# Patient Record
Sex: Female | Born: 1935 | Race: White | Hispanic: No | Smoking: Never smoker
Health system: Southern US, Community
[De-identification: ages and names within clinical notes are randomized; demographics above are authoritative.]

## PROBLEM LIST (undated history)

## (undated) DIAGNOSIS — E039 Hypothyroidism, unspecified: Secondary | ICD-10-CM

## (undated) DIAGNOSIS — K589 Irritable bowel syndrome without diarrhea: Secondary | ICD-10-CM

## (undated) DIAGNOSIS — F0390 Unspecified dementia without behavioral disturbance: Secondary | ICD-10-CM

## (undated) DIAGNOSIS — M1990 Unspecified osteoarthritis, unspecified site: Secondary | ICD-10-CM

## (undated) DIAGNOSIS — I10 Essential (primary) hypertension: Secondary | ICD-10-CM

## (undated) DIAGNOSIS — G43909 Migraine, unspecified, not intractable, without status migrainosus: Secondary | ICD-10-CM

## (undated) DIAGNOSIS — F419 Anxiety disorder, unspecified: Secondary | ICD-10-CM

## (undated) DIAGNOSIS — K219 Gastro-esophageal reflux disease without esophagitis: Secondary | ICD-10-CM

## (undated) HISTORY — PX: TONSILLECTOMY: SUR1361

---

## 2004-01-15 ENCOUNTER — Ambulatory Visit: Payer: Self-pay

## 2004-01-17 ENCOUNTER — Encounter: Payer: Self-pay | Admitting: Neurology

## 2004-02-09 ENCOUNTER — Encounter: Payer: Self-pay | Admitting: Neurology

## 2004-04-02 ENCOUNTER — Ambulatory Visit: Payer: Self-pay | Admitting: Gastroenterology

## 2004-06-18 ENCOUNTER — Ambulatory Visit: Payer: Self-pay | Admitting: Internal Medicine

## 2005-06-14 ENCOUNTER — Ambulatory Visit: Payer: Self-pay | Admitting: Specialist

## 2005-06-30 ENCOUNTER — Ambulatory Visit: Payer: Self-pay | Admitting: Internal Medicine

## 2005-12-10 ENCOUNTER — Ambulatory Visit: Payer: Self-pay

## 2006-07-04 ENCOUNTER — Ambulatory Visit: Payer: Self-pay | Admitting: Internal Medicine

## 2006-10-07 ENCOUNTER — Ambulatory Visit: Payer: Self-pay | Admitting: Internal Medicine

## 2006-12-01 ENCOUNTER — Encounter: Payer: Self-pay | Admitting: Internal Medicine

## 2006-12-10 ENCOUNTER — Encounter: Payer: Self-pay | Admitting: Internal Medicine

## 2007-01-09 ENCOUNTER — Encounter: Payer: Self-pay | Admitting: Internal Medicine

## 2007-08-17 ENCOUNTER — Ambulatory Visit: Payer: Self-pay | Admitting: Internal Medicine

## 2007-08-21 ENCOUNTER — Ambulatory Visit: Payer: Self-pay | Admitting: Internal Medicine

## 2008-05-28 ENCOUNTER — Encounter: Payer: Self-pay | Admitting: Internal Medicine

## 2008-06-08 ENCOUNTER — Encounter: Payer: Self-pay | Admitting: Internal Medicine

## 2008-07-15 ENCOUNTER — Encounter: Payer: Self-pay | Admitting: Internal Medicine

## 2008-07-30 ENCOUNTER — Ambulatory Visit: Payer: Self-pay | Admitting: Orthopedic Surgery

## 2008-08-01 ENCOUNTER — Ambulatory Visit: Payer: Self-pay | Admitting: Orthopedic Surgery

## 2008-08-08 ENCOUNTER — Encounter: Payer: Self-pay | Admitting: Internal Medicine

## 2008-12-17 ENCOUNTER — Ambulatory Visit: Payer: Self-pay | Admitting: Internal Medicine

## 2010-09-11 ENCOUNTER — Inpatient Hospital Stay: Payer: Self-pay | Admitting: Specialist

## 2011-06-19 ENCOUNTER — Inpatient Hospital Stay: Payer: Self-pay | Admitting: Internal Medicine

## 2011-06-19 LAB — CBC
HCT: 36.1 % (ref 35.0–47.0)
HGB: 12.6 g/dL (ref 12.0–16.0)
MCH: 29.6 pg (ref 26.0–34.0)
MCHC: 34.8 g/dL (ref 32.0–36.0)
MCV: 85 fL (ref 80–100)
RBC: 4.25 10*6/uL (ref 3.80–5.20)
RDW: 14.9 % — ABNORMAL HIGH (ref 11.5–14.5)

## 2011-06-19 LAB — COMPREHENSIVE METABOLIC PANEL
Bilirubin,Total: 0.8 mg/dL (ref 0.2–1.0)
Calcium, Total: 8.6 mg/dL (ref 8.5–10.1)
Co2: 24 mmol/L (ref 21–32)
Creatinine: 0.95 mg/dL (ref 0.60–1.30)
EGFR (Non-African Amer.): 57 — ABNORMAL LOW
Glucose: 121 mg/dL — ABNORMAL HIGH (ref 65–99)
Potassium: 3.5 mmol/L (ref 3.5–5.1)
SGPT (ALT): 19 U/L
Total Protein: 7.6 g/dL (ref 6.4–8.2)

## 2011-06-19 LAB — URINALYSIS, COMPLETE
Bacteria: NONE SEEN
Ketone: NEGATIVE
Nitrite: NEGATIVE
Protein: NEGATIVE
Squamous Epithelial: 1

## 2011-06-19 LAB — TROPONIN I: Troponin-I: <0.02 ng/mL

## 2011-06-20 LAB — BASIC METABOLIC PANEL
Anion Gap: 7 (ref 7–16)
Calcium, Total: 8.3 mg/dL — ABNORMAL LOW (ref 8.5–10.1)
EGFR (African American): >60
EGFR (Non-African Amer.): >60
Osmolality: 284 (ref 275–301)
Sodium: 142 mmol/L (ref 136–145)

## 2011-06-20 LAB — CBC WITH DIFFERENTIAL/PLATELET
Basophil #: 0 10*3/uL (ref 0.0–0.1)
Eosinophil %: 1.7 %
HCT: 33 % — ABNORMAL LOW (ref 35.0–47.0)
Lymphocyte #: 2.2 10*3/uL (ref 1.0–3.6)
Lymphocyte %: 10.4 %
MCH: 29.2 pg (ref 26.0–34.0)
MCHC: 34.4 g/dL (ref 32.0–36.0)
Monocyte #: 1.6 x10 3/mm — ABNORMAL HIGH (ref 0.2–0.9)
Neutrophil #: 17.1 10*3/uL — ABNORMAL HIGH (ref 1.4–6.5)
Platelet: 150 10*3/uL (ref 150–440)
RDW: 14.7 % — ABNORMAL HIGH (ref 11.5–14.5)

## 2011-06-21 LAB — CBC WITH DIFFERENTIAL/PLATELET
Eosinophil #: 0.5 10*3/uL (ref 0.0–0.7)
Eosinophil %: 3.6 %
Lymphocyte #: 2.3 10*3/uL (ref 1.0–3.6)
Lymphocyte %: 16.8 %
MCH: 29.2 pg (ref 26.0–34.0)
MCV: 85 fL (ref 80–100)
Monocyte #: 1.2 x10 3/mm — ABNORMAL HIGH (ref 0.2–0.9)
Neutrophil #: 9.5 10*3/uL — ABNORMAL HIGH (ref 1.4–6.5)
Platelet: 154 10*3/uL (ref 150–440)
RBC: 3.74 10*6/uL — ABNORMAL LOW (ref 3.80–5.20)
RDW: 14.7 % — ABNORMAL HIGH (ref 11.5–14.5)
WBC: 13.6 10*3/uL — ABNORMAL HIGH (ref 3.6–11.0)

## 2011-06-21 LAB — BASIC METABOLIC PANEL
Anion Gap: 6 — ABNORMAL LOW (ref 7–16)
BUN: 15 mg/dL (ref 7–18)
Calcium, Total: 8.2 mg/dL — ABNORMAL LOW (ref 8.5–10.1)
Chloride: 108 mmol/L — ABNORMAL HIGH (ref 98–107)
EGFR (African American): >60
EGFR (Non-African Amer.): >60
Osmolality: 287 (ref 275–301)

## 2011-06-22 LAB — BASIC METABOLIC PANEL
Anion Gap: 8 (ref 7–16)
BUN: 11 mg/dL (ref 7–18)
Calcium, Total: 8.2 mg/dL — ABNORMAL LOW (ref 8.5–10.1)
Chloride: 107 mmol/L (ref 98–107)
Co2: 28 mmol/L (ref 21–32)
Creatinine: 0.75 mg/dL (ref 0.60–1.30)
EGFR (Non-African Amer.): >60
Osmolality: 284 (ref 275–301)
Potassium: 3.1 mmol/L — ABNORMAL LOW (ref 3.5–5.1)

## 2011-06-22 LAB — CBC WITH DIFFERENTIAL/PLATELET
Basophil #: 0.1 10*3/uL (ref 0.0–0.1)
Lymphocyte #: 2.3 10*3/uL (ref 1.0–3.6)
Lymphocyte %: 19.3 %
Monocyte %: 10 %
Neutrophil #: 7.8 10*3/uL — ABNORMAL HIGH (ref 1.4–6.5)
Neutrophil %: 65.6 %
Platelet: 160 10*3/uL (ref 150–440)
WBC: 11.8 10*3/uL — ABNORMAL HIGH (ref 3.6–11.0)

## 2011-06-23 LAB — BASIC METABOLIC PANEL
Anion Gap: 9 (ref 7–16)
BUN: 13 mg/dL (ref 7–18)
Chloride: 107 mmol/L (ref 98–107)
Creatinine: 0.72 mg/dL (ref 0.60–1.30)
EGFR (Non-African Amer.): >60
Osmolality: 285 (ref 275–301)

## 2011-06-24 LAB — CULTURE, BLOOD (SINGLE)

## 2014-06-01 ENCOUNTER — Emergency Department
Admit: 2014-06-01 | Disposition: A | Discharge status: Home / Self Care | Payer: Self-pay | Admitting: Emergency Medicine

## 2014-06-02 LAB — URINALYSIS, COMPLETE
BILIRUBIN, UR: NEGATIVE
BLOOD: NEGATIVE
GLUCOSE, UR: NEGATIVE mg/dL (ref 0–75)
Ketone: NEGATIVE
LEUKOCYTE ESTERASE: NEGATIVE
Nitrite: NEGATIVE
PH: 5 (ref 4.5–8.0)
Protein: NEGATIVE
SPECIFIC GRAVITY: 1.013 (ref 1.003–1.030)

## 2014-06-02 LAB — BASIC METABOLIC PANEL
Anion Gap: 11 (ref 7–16)
BUN: 19 mg/dL
CALCIUM: 8.8 mg/dL — AB
CREATININE: 1.2 mg/dL — AB
Chloride: 93 mmol/L — ABNORMAL LOW
Co2: 29 mmol/L
GFR CALC AF AMER: 48 — AB
GFR CALC NON AF AMER: 42 — AB
Glucose: 113 mg/dL — ABNORMAL HIGH
POTASSIUM: 3 mmol/L — AB
SODIUM: 133 mmol/L — AB

## 2014-06-02 NOTE — H&P (Signed)
PATIENT NAME:  Regina Fields, Regina Fields MR#:  [id number] DATE OF BIRTH:  1928/04/19  DATE OF ADMISSION:  06/19/2011  PRIMARY CARE PHYSICIAN: Dr. Alonna Buckler.   REASON FOR ADMISSION: Patient is a resident of [facility] facility. Patient was sent from [facility] because of high grade fever, temperature of 103.6, cough and congestion, inattentive and poor appetite.   HISTORY OF PRESENT ILLNESS: 81 year old female, she is a resident of [facility] facility. She has history of hypertension, hypothyroidism, anxiety disorder, gastroesophageal reflux disease, history of C. difficile colitis in the past, irritable bowel syndrome, she has history of frequent UTIs in the past also. She was last admitted in August 2012 because of systemic inflammatory response syndrome, source unknown. Today she was sent from the facility because she was running a fever with temperature of 103.6. She is a poor historian. She is mainly complaining of like she is having a cough going on for a few days. She says she is coughing her brains out. She denies any expectoration or sputum production. She denies any shortness of breath or pleuritic chest pain. No sinus drainage. The vitals from [facility] facility says that she had a heart rate of 100, respiratory rate of 40 and a pulse ox of 96% but when she came to the Emergency Room her pulse ox was in the range of 91% on room air when she presented here. She denies any nausea, vomiting or any abdominal pain. Denies any diarrhea right now. Her had initial work-up in the Emergency Room showed that she had a white count of 24.6 thousand with a clean urine and a chest x-ray which was read as hyperinflation. She is being admitted for systemic inflammatory response syndrome with possible pneumonia. She already got a dose of ceftriaxone and Zithromax in the Emergency Room.   REVIEW OF SYSTEMS: Positive for fever. She denies any weakness. She is also complaining of poor appetite for last few days. No acute  change in vision. No headache. No dizziness. She is complaining of cough but she denies any painful respiration. No hemoptysis. No chronic obstructive pulmonary disease. No chest pain. No palpitations or syncope. No nausea, vomiting, abdominal pain, GI bleed. No diarrhea. She is complaining of dysuria which she says is chronic for her. Denies any frequency or hematuria. She has thyroid problems. No anemia. No rash. No joint pain. She has chronic leg swelling. She denies any focal numbness or weakness. No anxiety.    PAST MEDICAL HISTORY:  1. Hypothyroidism. 2. Anxiety disorder.  3. Gastroesophageal reflux disease.  4. Osteoarthritis.  5. Ankylosing arthritis of the hands.  6. She had atypical chest pain in the past with negative cardiac catheterization. 7. Osteoporosis. 8. History of positional vertigo.  9. History of left carpal tunnel syndrome.  10. Hypertension.  11. History of C. difficile colitis. 12. History of recurrent weak spells which have been ongoing for many years. 13. Irritable bowel syndrome. 14. Hyperlipidemia. 15. Diverticular disease. 16. Lower extremity varicosities with edema. 17. Asthma. 18. Colonic polyps.  19. She also has recurrent urinary tract infections and she is on Macrobid for prophylaxis.   PAST SURGICAL HISTORY:  1. Thyroidectomy resulting hypothyroidism. 2. Tonsillectomy. 3. C-section. 4. Left open reduction internal fixation of the shoulder in June 2010.   HOME MEDICATIONS: As per the [facility] obtained from the nursing home include:  1. Lasix 40 mg daily as needed for swelling.  2. Atenolol 50 mg daily.  3. Vitamin C 500 mg once a day. 4.  Norvasc 5 mg daily.  5. Macrobid 100 mg daily. 6. Vitamin E 400 units daily.  7. Multivitamin daily.  8. Lysine 500 mg daily. 9. Vitamin D 400 units daily.  10. Singulair 10 mg once daily. 11. Ginkgo biloba once daily.  12. Omega-3 fatty acid once daily.  13. Coenzyme Q10. 14. Aspirin 81 mg daily.  15. Motrin  200 mg twice a day. 16. Glucosamine chondroitin once daily.  17. Calcium and vitamin D twice a day. 18. Albuterol 2 puffs every four hours as needed.  19. Robitussin syrup q.4 hours p.r.n.  20. Tylenol p.r.n.   SOCIAL HISTORY: She is a resident of [place]. She denies any smoking or alcohol use. She is divorced with one child. 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    FAMILY HISTORY: Father died at 2072 of cancer. Mother died of congestive heart failure. Brother died at 6674 of cardiac arrest.   PHYSICAL EXAMINATION:  VITAL SIGNS: Her vitals at [facility] include temperature 103.6, blood pressure 144/80, heart rate 100, respiratory rate 40. Her vitals when she presented to the Emergency Room here include: Temperature 100.6, heart rate 93, respiratory rate 28, blood pressure 141/60, saturating 91% on room air. Currently her blood pressure is 119/58, 93% to 94% on 2 liters nasal cannula.   GENERAL: This is an elderly, obese Caucasian female. She is sitting upright in bed, does not appear to be in any acute distress though she is coughing.   HEENT: Bilateral pupils are equal. Extraocular muscles are intact. No scleral icterus. No conjunctivitis. Oral mucosa slightly dry. No pallor.   NECK: No thyroid tenderness, enlargement or nodule. Neck is supple. No masses, nontender. No adenopathy. No JVD. No carotid bruit.   CHEST: Normal respiratory effort but she has rhonchi and wheezing mainly in the right side, the left is clearer. Not using accessory muscles of respiration.   HEART: Heart sounds are regular. No murmur. She has good peripheral pulses. She has bilateral nonpitting lower extremity edema.   ABDOMEN: Obese, soft, nontender. Normal bowel sounds. No hepatomegaly. No bruit. No masses appreciable secondary to obesity.   NEUROLOGIC: She is awake. She is alert. She is oriented but she is a poor historian. She has difficulty recalling events. Her cranial  nerves are intact. Moving all extremities against gravity. No cyanosis. No clubbing.   SKIN: She has bilateral nonpitting lower extremity edema. No rashes. No lesions.   LABORATORY, DIAGNOSTIC AND RADIOLOGICAL DATA: Her lab work today shows that she has white count 24.6, hemoglobin 12.6, platelet count 160,000. Her last white count was 9.2 . BMP: Sodium 140, potassium 3.5, BUN 15, creatinine 0.95. LFTs are normal. Her troponin is negative. Her urinalysis is essentially clean with nitrite and leukocyte esterase and WBC negative. Her chest x-ray has been reported as hyperinflation with right lung presumed atelectasis.   IMPRESSION:  1. Systemic inflammatory response syndrome as evidenced by fever, leukocytosis, hypoxia and tachycardia, suspect pneumonia on the right side. 2. Hypertension. 3. Hypothyroidism. 4. Hyperlipidemia. 5. History of asthma.   PLAN: An 81 year old female. She is a resident [facility]. She has history of hypertension, hyperlipidemia, hypothyroidism, osteoarthritis, asthma and today she comes in with cough going on for a few days. The reason she was sent to the Emergency Room was high-grade fever. She was mildly hypoxic in the Emergency Room, around 91% on room air. She has wheezing and rhonchi on the right side more than the  left. The chest x-ray is not reported as pneumonia but with a white count of 24,000, fever and abnormal lung exam will treat it as pneumonia. She has already been started on ceftriaxone and Zithromax; will continue that. Blood cultures have been sent. Will repeat a chest x-ray PA and lateral view tomorrow after hydration to see if any infiltrate develops after hydration. Will start her on some Robitussin, DuoNebs p.r.n. also. Her urinalysis has been negative and she is not complaining of any diarrhea. If diarrhea happens then a stool for Clostridium difficile should be checked on this patient because of her previous history of Clostridium difficile  colitis. Will also give her albuterol nebs.     TIME SPENT WITH ADMISSION AND COORDINATION: 40 minutes.   ____________________________ Fredia Sorrow, MD ag:cms D: 06/19/2011 15:22:37 ET T: 06/19/2011 15:57:46 ET JOB#: [id number]  cc: Fredia Sorrow, MD, <Dictator> Reola Mosher. Randa Lynn, MD Fredia Sorrow MD ELECTRONICALLY SIGNED 07/17/2011 13:46

## 2014-06-02 NOTE — Discharge Summary (Signed)
PATIENT NAME:  Regina Fields, Regina Fields MR#:  [id number] DATE OF BIRTH:  1928/10/16  DATE OF ADMISSION:  06/19/2011 DATE OF DISCHARGE:  06/23/2011   ADMITTING DIAGNOSIS: Fever, cough.   DISCHARGE DIAGNOSES:  1. Fever, cough due to likely community-acquired pneumonia. The patient is afebrile, cough improved.  2. Hypertension.  3. Hypothyroidism.   4. Gastroesophageal reflux disease.  5. Electrolyte imbalance.  6. Hypothyroidism.  7. Anxiety disorder.  8. Osteoarthritis.  9. Ankylosing arthritis of the hands.  10. History of atypical chest pain with negative cardiac cath in the past.  11. Osteoporosis.  12. SIRS syndrome due to pneumonia.  13. History of positional vertigo.  14. History of left carpal tunnel syndrome.  15. History of C. difficile.  16. History of recurrent weak spells going on for many years.  17. Irritable bowel syndrome.  18. Hyperlipidemia.  19. Diverticular disease.  20. Lower extremity varicosities with edema.  21. History of asthma.  22. Colonic polyps.  23. History of recurrent urinary tract infections and is on chronic Macrobid prophylaxis.  24. Status post thyroidectomy.  25. Status post tonsillectomy.  26. Status post Cesarean section.  27. Status post left ORIF for shoulder injury in 2010.   PERTINENT LABS AND EVALUATIONS: Admitting glucose 121, BUN 15, creatinine 0.95, sodium 140, potassium 3.5, chloride 107, CO2 24, calcium 8.6. LFTs total protein 7.6, albumin 3.3, bilirubin total 0.8, alkaline phosphatase 76. Troponin less than 0.02. WBC count on admission was 24.6, hemoglobin 12.6, platelet count 160. Most recent WBC count is 11.8 on May 14th. Urine cultures no growth. Blood cultures no growth.   Chest x-ray on presentation showed possible mild interstitial edema, trace to small bilateral effusions.   CONSULTANTS: None.   HOSPITAL COURSE: The patient is a pleasant 81 year old white female with multiple medical problems, resident at [facility] facility, who  was noted to have fevers of 103.6. The patient was also complaining of having cough for the past few days. The patient at the facility was noted to have respiratory rate in the 40's and drop in oxygen sat. Due to these, she was brought to the ED. In the ER she was noted to have a WBC count of 24,000. Chest x-ray showed bilateral interstitial infiltrates and thought to have possible atypical pneumonia. She was treated with antibiotics with Rocephin and azithromycin with resolution of her fevers as well as her respiratory symptoms. The patient is doing much better and will finish a course of antibiotics at the facility.   DISCHARGE MEDICATIONS:  1. Lasix 40 daily.  2. Atenolol 50 daily.  3. Vitamin C 500 daily.  4. Norvasc 5 daily.  5. Macrobid 100 daily.  6. Vitamin E 400 units daily.  7. Multivitamin daily.  8. Lysine 500 daily.  9. Vitamin D 400 units daily.  10. Singular 10 daily.  11. Ginkgo biloba one tablet p.o. daily.  12. Omega-3 fatty acid daily.  13. Co-Enzyme Q10 daily.  14. Aspirin 81 one tab p.o. daily.  15. Motrin 200 one tab p.o. b.i.d.  16. Glucosamine/chondroitin 1 tab p.o. daily.  17. Calcium Plus Vitamin D one tab p.o. b.i.d.  18. Albuterol 2 puffs q.4 p.r.n.  19. Robitussin q.4 p.r.n.  20. Tylenol p.r.n.  21. Imodium AD 2 tabs as needed.  22. Ceftin 500 p.o. q.12 hours x3 days.  23. Azithromycin 500 p.o. daily x3 days.   HOME OXYGEN: None.   DIET: Low sodium.   ACTIVITY: As tolerated.   REFERRALS: PT and OT.  FOLLOW-UP: Follow-up with Dr. Randa Lynn in 1 to 2 weeks.  TIME SPENT: 35 minutes.   ____________________________ Lacie Scotts Allena Katz, MD shp:drc D: 06/23/2011 13:11:34 ET T: 06/23/2011 13:37:51 ET JOB#: [id number]  cc: Justo Hengel Fields. Allena Katz, MD, <Dictator> Reola Mosher. Randa Lynn, MD Charise Carwin MD ELECTRONICALLY SIGNED 06/25/2011 14:44

## 2014-06-05 LAB — URINE CULTURE

## 2015-03-01 ENCOUNTER — Emergency Department: Payer: Medicare Other

## 2015-03-01 ENCOUNTER — Inpatient Hospital Stay
Admission: EM | Admit: 2015-03-01 | Discharge: 2015-03-05 | DRG: 189 | Disposition: A | Discharge status: Skilled Nursing Facility | Payer: Medicare Other | Attending: Internal Medicine | Admitting: Internal Medicine

## 2015-03-01 DIAGNOSIS — R0600 Dyspnea, unspecified: Secondary | ICD-10-CM

## 2015-03-01 DIAGNOSIS — N170 Acute kidney failure with tubular necrosis: Secondary | ICD-10-CM | POA: Diagnosis present

## 2015-03-01 DIAGNOSIS — R600 Localized edema: Secondary | ICD-10-CM | POA: Diagnosis present

## 2015-03-01 DIAGNOSIS — J9600 Acute respiratory failure, unspecified whether with hypoxia or hypercapnia: Secondary | ICD-10-CM | POA: Diagnosis present

## 2015-03-01 DIAGNOSIS — J9601 Acute respiratory failure with hypoxia: Secondary | ICD-10-CM | POA: Diagnosis present

## 2015-03-01 DIAGNOSIS — I10 Essential (primary) hypertension: Secondary | ICD-10-CM | POA: Diagnosis present

## 2015-03-01 DIAGNOSIS — E039 Hypothyroidism, unspecified: Secondary | ICD-10-CM | POA: Diagnosis present

## 2015-03-01 DIAGNOSIS — E876 Hypokalemia: Secondary | ICD-10-CM | POA: Diagnosis present

## 2015-03-01 DIAGNOSIS — J209 Acute bronchitis, unspecified: Secondary | ICD-10-CM | POA: Diagnosis present

## 2015-03-01 DIAGNOSIS — J069 Acute upper respiratory infection, unspecified: Secondary | ICD-10-CM

## 2015-03-01 DIAGNOSIS — F0390 Unspecified dementia without behavioral disturbance: Secondary | ICD-10-CM | POA: Diagnosis present

## 2015-03-01 DIAGNOSIS — R0602 Shortness of breath: Secondary | ICD-10-CM

## 2015-03-01 DIAGNOSIS — R0682 Tachypnea, not elsewhere classified: Secondary | ICD-10-CM | POA: Diagnosis present

## 2015-03-01 HISTORY — DX: Unspecified dementia, unspecified severity, without behavioral disturbance, psychotic disturbance, mood disturbance, and anxiety: F03.90

## 2015-03-01 LAB — CBC WITH DIFFERENTIAL/PLATELET
Basophils Absolute: 0.1 10*3/uL (ref 0–0.1)
Basophils Relative: 1 %
EOS ABS: 0.3 10*3/uL (ref 0–0.7)
Eosinophils Relative: 2 %
HEMATOCRIT: 39.9 % (ref 35.0–47.0)
Hemoglobin: 13.2 g/dL (ref 12.0–16.0)
LYMPHS ABS: 3.1 10*3/uL (ref 1.0–3.6)
Lymphocytes Relative: 22 %
MCH: 27.6 pg (ref 26.0–34.0)
MCHC: 33.1 g/dL (ref 32.0–36.0)
MCV: 83.5 fL (ref 80.0–100.0)
MONO ABS: 2.7 10*3/uL — AB (ref 0.2–0.9)
Monocytes Relative: 19 %
NEUTROS PCT: 56 %
Neutro Abs: 7.9 10*3/uL — ABNORMAL HIGH (ref 1.4–6.5)
PLATELETS: 167 10*3/uL (ref 150–440)
RBC: 4.78 MIL/uL (ref 3.80–5.20)
RDW: 15.3 % — AB (ref 11.5–14.5)
WBC: 14.1 10*3/uL — ABNORMAL HIGH (ref 3.6–11.0)

## 2015-03-01 LAB — TROPONIN I: Troponin I: <0.03 ng/mL (ref <0.031)

## 2015-03-01 LAB — COMPREHENSIVE METABOLIC PANEL
ALK PHOS: 74 U/L (ref 38–126)
ALT: 22 U/L (ref 14–54)
AST: 35 U/L (ref 15–41)
Albumin: 4.1 g/dL (ref 3.5–5.0)
Anion gap: 8 (ref 5–15)
BILIRUBIN TOTAL: 0.5 mg/dL (ref 0.3–1.2)
BUN: 25 mg/dL — AB (ref 6–20)
CALCIUM: 8.9 mg/dL (ref 8.9–10.3)
CO2: 31 mmol/L (ref 22–32)
Chloride: 98 mmol/L — ABNORMAL LOW (ref 101–111)
Creatinine, Ser: 1.29 mg/dL — ABNORMAL HIGH (ref 0.44–1.00)
GFR, EST AFRICAN AMERICAN: 43 mL/min — AB (ref >60)
GFR, EST NON AFRICAN AMERICAN: 37 mL/min — AB (ref >60)
Glucose, Bld: 104 mg/dL — ABNORMAL HIGH (ref 65–99)
Potassium: 3.2 mmol/L — ABNORMAL LOW (ref 3.5–5.1)
Sodium: 137 mmol/L (ref 135–145)
Total Protein: 8.8 g/dL — ABNORMAL HIGH (ref 6.5–8.1)

## 2015-03-01 LAB — RAPID INFLUENZA A&B ANTIGENS: Influenza B (ARMC): NEGATIVE

## 2015-03-01 LAB — RAPID INFLUENZA A&B ANTIGENS (ARMC ONLY): INFLUENZA A (ARMC): NEGATIVE

## 2015-03-01 LAB — LACTIC ACID, PLASMA: Lactic Acid, Venous: 1.9 mmol/L (ref 0.5–2.0)

## 2015-03-01 MED ORDER — IPRATROPIUM-ALBUTEROL 0.5-2.5 (3) MG/3ML IN SOLN
3.0000 mL | Freq: Four times a day (QID) | RESPIRATORY_TRACT | Status: DC
  Administered 2015-03-01 – 2015-03-05 (×15): 3 mL via RESPIRATORY_TRACT
  Filled 2015-03-01 (×15): qty 3

## 2015-03-01 MED ORDER — DEXTROSE 5 % IV SOLN
500.0000 mg | INTRAVENOUS | Status: DC
  Administered 2015-03-01 – 2015-03-03 (×3): 500 mg via INTRAVENOUS
  Filled 2015-03-01 (×4): qty 500

## 2015-03-01 MED ORDER — POTASSIUM CHLORIDE 10 MEQ/100ML IV SOLN
10.0000 meq | INTRAVENOUS | Status: AC
  Administered 2015-03-01 – 2015-03-02 (×3): 10 meq via INTRAVENOUS
  Filled 2015-03-01 (×4): qty 100

## 2015-03-01 MED ORDER — ONDANSETRON HCL 4 MG/2ML IJ SOLN: 4.0000 mg | Freq: Four times a day (QID) | INTRAMUSCULAR | Status: DC | PRN

## 2015-03-01 MED ORDER — BISACODYL 5 MG PO TBEC
5.0000 mg | DELAYED_RELEASE_TABLET | Freq: Every day | ORAL | Status: DC | PRN
  Filled 2015-03-01: qty 1

## 2015-03-01 MED ORDER — FUROSEMIDE 10 MG/ML IJ SOLN
40.0000 mg | Freq: Once | INTRAMUSCULAR | Status: AC
  Administered 2015-03-01: 40 mg via INTRAVENOUS
  Filled 2015-03-01: qty 4

## 2015-03-01 MED ORDER — DOCUSATE SODIUM 100 MG PO CAPS
100.0000 mg | ORAL_CAPSULE | Freq: Two times a day (BID) | ORAL | Status: DC
  Administered 2015-03-02 – 2015-03-05 (×6): 100 mg via ORAL
  Filled 2015-03-01 (×6): qty 1

## 2015-03-01 MED ORDER — ACETAMINOPHEN 650 MG RE SUPP: 650.0000 mg | Freq: Four times a day (QID) | RECTAL | Status: DC | PRN

## 2015-03-01 MED ORDER — AZITHROMYCIN 250 MG PO TABS
500.0000 mg | ORAL_TABLET | Freq: Once | ORAL | Status: DC
  Filled 2015-03-01: qty 2

## 2015-03-01 MED ORDER — HYDROCODONE-ACETAMINOPHEN 5-325 MG PO TABS
1.0000 | ORAL_TABLET | ORAL | Status: DC | PRN
  Administered 2015-03-02: 1 via ORAL
  Filled 2015-03-01: qty 1

## 2015-03-01 MED ORDER — HEPARIN SODIUM (PORCINE) 5000 UNIT/ML IJ SOLN
5000.0000 [IU] | Freq: Three times a day (TID) | INTRAMUSCULAR | Status: DC
  Administered 2015-03-01 – 2015-03-05 (×11): 5000 [IU] via SUBCUTANEOUS
  Filled 2015-03-01 (×11): qty 1

## 2015-03-01 MED ORDER — DEXTROSE 5 % IV SOLN
1.0000 g | INTRAVENOUS | Status: DC
  Administered 2015-03-01 – 2015-03-05 (×4): 1 g via INTRAVENOUS
  Filled 2015-03-01 (×5): qty 10

## 2015-03-01 MED ORDER — ONDANSETRON HCL 4 MG PO TABS: 4.0000 mg | ORAL_TABLET | Freq: Four times a day (QID) | ORAL | Status: DC | PRN

## 2015-03-01 MED ORDER — ACETAMINOPHEN 325 MG PO TABS: 650.0000 mg | ORAL_TABLET | Freq: Four times a day (QID) | ORAL | Status: DC | PRN

## 2015-03-01 MED ORDER — DEXTROSE 5 % IV SOLN
1.0000 g | Freq: Once | INTRAVENOUS | Status: AC
  Administered 2015-03-01: 1 g via INTRAVENOUS
  Filled 2015-03-01: qty 10

## 2015-03-01 NOTE — ED Notes (Signed)
ptassisted with bedpan with return of approx slightly concentrated yellow urine.

## 2015-03-01 NOTE — H&P (Signed)
[facility] [place] [facility]   PATIENT NAME: Regina Fields    MR#:  [id number]  DATE OF BIRTH:  10-06-1928  DATE OF ADMISSION:  03/01/2015  PRIMARY CARE PHYSICIAN: No primary care provider on file.   REQUESTING/REFERRING PHYSICIAN:DR.Minna Antis  CHIEF COMPLAINT:   Chief Complaint  Patient presents with  . Shortness of Breath    HISTORY OF PRESENT ILLNESS:  Regina Fields  is a 82 y.o. female with a known history of dementia brought in from [facility]  [place] because of shortness of breath, increased respiratory rate up to 30s, hypoxia with O2 sats 90% on room air. Patient chest x-ray negative for pneumonia, because of hypoxia with O2 sats 90% on room air, increased respiratory rate up to 30s patient was referred for admission. Patient has dementia and unable to give me any history. Has some expiratory wheezing bilaterally.  PAST MEDICAL HISTORY:   Past Medical History  Diagnosis Date  . Dementia     PAST SURGICAL HISTOIRY:  No past surgical history on file.  SOCIAL HISTORY:   Social History  Substance Use Topics  . Smoking status: Not on file  . Smokeless tobacco: Not on file  . Alcohol Use: Not on file    FAMILY HISTORY:  No family history on file.  DRUG ALLERGIES:  No Known Allergies  REVIEW OF SYSTEMS:unable to obtain RoS due to dementia.    MEDICATIONS AT HOME:   Prior to Admission medications   Not on File      VITAL SIGNS:  Blood pressure 139/60, pulse 76, temperature 99.8 F (37.7 C), temperature source Oral, resp. rate 25, weight 63.504 kg (140 lb), SpO2 97 %.  PHYSICAL EXAMINATION:  GENERAL:  82 y.o.-year-old patient lying in the bed with no acute distress.  EYES: Pupils equal, round, reactive to light and accommodation. No scleral icterus. Extraocular muscles intact.  HEENT: Head atraumatic, normocephalic. Oropharynx and nasopharynx clear.  NECK:  Supple, no jugular venous distention. No thyroid enlargement, no  tenderness.  LUNGS: Bilateral Expiratory wheeze  In all lung fields,  No rales,rhonchi or crepitation. No use of accessory muscles of respiration.  CARDIOVASCULAR: S1, S2 normal. No murmurs, rubs, or gallops.  ABDOMEN: Soft, nontender, nondistended. Bowel sounds present. No organomegaly or mass.  EXTREMITIES:  Bilateral trace pedal edema,  No cyanosis, or clubbing.  NEUROLOGIC:  Unable to obtain full ROS due to dementia. PSYCHIATRIC: The patient  Is confused/baseline dementia SKIN: No obvious rash, lesion, or ulcer.   LABORATORY PANEL:   CBC  Recent Labs Lab 03/01/15 1518  WBC 14.1*  HGB 13.2  HCT 39.9  PLT 167   ------------------------------------------------------------------------------------------------------------------  Chemistries   Recent Labs Lab 03/01/15 1518  NA 137  K 3.2*  CL 98*  CO2 31  GLUCOSE 104*  BUN 25*  CREATININE 1.29*  CALCIUM 8.9  AST 35  ALT 22  ALKPHOS 74  BILITOT 0.5   ------------------------------------------------------------------------------------------------------------------  Cardiac Enzymes  Recent Labs Lab 03/01/15 1518  TROPONINI <0.03   ------------------------------------------------------------------------------------------------------------------  RADIOLOGY:  Dg Chest 2 View  03/01/2015  CLINICAL DATA:  Shortness of breath starting this morning, dementia EXAM: CHEST  2 VIEW COMPARISON:  06/20/2011 FINDINGS: Cardiomediastinal silhouette is stable. Mild interstitial prominence bilateral without convincing pulmonary edema. Metallic fixation plate and screws in proximal left humerus again noted. No segmental infiltrate. Osteopenia and degenerative changes thoracic spine. Again noted significant compression deformity upper lumbar spine stable. IMPRESSION: Mild interstitial prominence bilateral without convincing pulmonary edema. No segmental  infiltrate. Osteopenia and mild degenerative changes thoracic spine. Electronically  Signed   By: Natasha Mead M.D.   On: 03/01/2015 15:53    EKG:   Orders placed or performed during the hospital encounter of 03/01/15  . ED EKG  . ED EKG  Sinus rhythm 71 BPM  IMPRESSION AND PLAN:  1. Acute hypoxic respiratory failure likely secondary to bronchitis: Patient is a admitted to hospitalist service, continue oxygen, started on Rocephin, Zithromax. Continue nebulizers. Likely discharge tomorrow. #2 hypokalemia replace the potassium. #3 mild acute renal failure likely ATN: She doesn't look  dehydrated. Does have  Leg edema,   #4. Dementia;  CODE STATUS full code     All the records are reviewed and case discussed with ED provider. Management plans discussed with the patient, family and they are in agreement.  CODE STATUS:full  TOTAL TIME TAKING CARE OF THIS PATIENT: 55 min.    Katha Hamming M.D on 03/01/2015 at 6:51 PM  Between 7am to 6pm - Pager - [phone]  After 6pm go to [email or web address] - password [email or web address]  [place] [facility]  Office  [phone]  CC: Primary care physician; No primary care provider on file.  Note: This dictation was prepared with Dragon dictation along with smaller phrase technology. Any transcriptional errors that result from this process are unintentional.

## 2015-03-01 NOTE — ED Provider Notes (Signed)
[facility] Emergency Department Provider Note  Time seen: 3:10 PM  I have reviewed the triage vital signs and the nursing notes.   HISTORY  Chief Complaint Shortness of Breath    HPI Regina Fields is a 82 y.o. female with a past medical history of dementia who presents from [facility] [place] skilled [facility] facility with increased respiratory rate, para shortness of breath, and room air oxygen saturation 91%. Patient has baseline confusion and cannot give an adequate history. Here the patient denies any trouble breathing, cough, congestion, chest pain. However patient appears to have tachypnea, and speaks in 2-3 word sentences.     Past Medical History  Diagnosis Date  . Dementia     There are no active problems to display for this patient.   No past surgical history on file.  No current outpatient prescriptions on file.  Allergies Review of patient's allergies indicates no known allergies.  No family history on file.  Social History Social History  Substance Use Topics  . Smoking status: None  . Smokeless tobacco: None  . Alcohol Use: None    Review of Systems Unable to obtain an adequate review of systems due to dementia.  ____________________________________________   PHYSICAL EXAM:  VITAL SIGNS: ED Triage Vitals  Enc Vitals Group     BP 03/01/15 1458 155/72 mmHg     Pulse --      Resp --      Temp 03/01/15 1458 99.8 F (37.7 C)     Temp Source 03/01/15 1458 Oral     SpO2 03/01/15 1458 93 %     Weight 03/01/15 1458 140 lb (63.504 kg)     Height --      Head Cir --      Peak Flow --      Pain Score --      Pain Loc --      Pain Edu? --      Excl. in GC? --     Constitutional: Alert.Well appearing and in no distress. Eyes: Normal exam ENT   Head: Normocephalic and atraumatic.   Mouth/Throat: Mucous membranes are moist. Cardiovascular: Normal rate, regular rhythm. No murmur Respiratory: No respiratory distress, and  the patient does have mild tachypnea breathing around 20-25 breaths per minute. Moderate expiratory wheeze on the left lung fields. No obvious rhonchi or rales. Gastrointestinal: Soft and nontender. No distention.   Musculoskeletal: Nontender with normal range of motion in all extremities. No lower extremity tenderness or edema. Neurologic:  Normal speech and language. No gross focal neurologic deficits Skin:  Skin is warm, dry and intact.  Psychiatric: Mood and affect are normal. Calm and cooperative. ____________________________________________    EKG  EKG reviewed and interpreted by myself shows normal sinus rhythm at 71 bpm, narrow QRS, left axis deviation, largely normal intervals, nonspecific ST changes. No ST elevations.  ____________________________________________    RADIOLOGY  Chest x-ray shows mild interstitial prominence, without convincing pulmonary edema. No infiltrate.  ____________________________________________   INITIAL IMPRESSION / ASSESSMENT AND PLAN / ED COURSE  Pertinent labs & imaging results that were available during my care of the patient were reviewed by me and considered in my medical decision making (see chart for details).  Patient presents the emergency department from her nursing facility for shortness of breath and a low respiratory rate. Patient was also noted to have a temperature of 99.8. Patient has dementia and cannot give an adequate history. Based on her exam the patient does  have mild tachypnea as well as moderate left-sided wheeze, with a room air oxygen saturation around 90%, now placed on 3 L nasal cannula satting in the low to mid 90s. We will obtain labs, blood cultures, chest x-ray, and closely monitor in the emergency department.  Chest x-ray shows no clear pneumonia. However the patient does have an elevated temperature, elevated white blood cell count, elevated respiratory rate, elevated respiratory rate and room air oxygen saturation of  90% currently. Given these findings we will cover with antibiotics and admit to the hospital.    ____________________________________________   FINAL CLINICAL IMPRESSION(S) / ED DIAGNOSES  Dyspnea URI  Minna Antis, MD 03/01/15 [phone]

## 2015-03-01 NOTE — ED Notes (Signed)
Report from felicia, rn.  

## 2015-03-01 NOTE — ED Notes (Signed)
Pts son called and updated on pts status.

## 2015-03-01 NOTE — ED Notes (Signed)
Pt came to ED via EMS from [facility]. Per staff at [facility], pt began experiencing shortness of breath this morning and had a respiratory rate of 99.8. Pt satting 91% on r/a with EMS. Placed on 3 L .

## 2015-03-01 NOTE — ED Notes (Signed)
Pts son Trey Paula (power of attorney) called to receive updates on pt. Can be reached at [phone].

## 2015-03-01 NOTE — ED Notes (Signed)
Pt complains of pain to hand where potassium infusing, no s/s of infiltration noted. Rate slowed to 13mL/hr, 30meq/hr.

## 2015-03-02 ENCOUNTER — Encounter: Payer: Self-pay | Admitting: *Deleted

## 2015-03-02 LAB — BASIC METABOLIC PANEL
ANION GAP: 8 (ref 5–15)
BUN: 23 mg/dL — ABNORMAL HIGH (ref 6–20)
CALCIUM: 8.4 mg/dL — AB (ref 8.9–10.3)
CHLORIDE: 99 mmol/L — AB (ref 101–111)
CO2: 30 mmol/L (ref 22–32)
Creatinine, Ser: 1.25 mg/dL — ABNORMAL HIGH (ref 0.44–1.00)
GFR calc non Af Amer: 38 mL/min — ABNORMAL LOW (ref >60)
GFR, EST AFRICAN AMERICAN: 44 mL/min — AB (ref >60)
GLUCOSE: 89 mg/dL (ref 65–99)
POTASSIUM: 3.6 mmol/L (ref 3.5–5.1)
Sodium: 137 mmol/L (ref 135–145)

## 2015-03-02 LAB — CBC
HEMATOCRIT: 33.7 % — AB (ref 35.0–47.0)
HEMOGLOBIN: 11.2 g/dL — AB (ref 12.0–16.0)
MCH: 27.7 pg (ref 26.0–34.0)
MCHC: 33.3 g/dL (ref 32.0–36.0)
MCV: 83.2 fL (ref 80.0–100.0)
Platelets: 129 10*3/uL — ABNORMAL LOW (ref 150–440)
RBC: 4.06 MIL/uL (ref 3.80–5.20)
RDW: 14.8 % — ABNORMAL HIGH (ref 11.5–14.5)
WBC: 11.7 10*3/uL — ABNORMAL HIGH (ref 3.6–11.0)

## 2015-03-02 LAB — GLUCOSE, CAPILLARY: GLUCOSE-CAPILLARY: 92 mg/dL (ref 65–99)

## 2015-03-02 LAB — MRSA PCR SCREENING: MRSA by PCR: NEGATIVE

## 2015-03-02 MED ORDER — POTASSIUM CHLORIDE 10 MEQ/100ML IV SOLN
10.0000 meq | Freq: Once | INTRAVENOUS | Status: AC
  Administered 2015-03-02: 10 meq via INTRAVENOUS
  Filled 2015-03-02: qty 100

## 2015-03-02 MED ORDER — CETYLPYRIDINIUM CHLORIDE 0.05 % MT LIQD
7.0000 mL | Freq: Two times a day (BID) | OROMUCOSAL | Status: DC
  Administered 2015-03-02 – 2015-03-05 (×5): 7 mL via OROMUCOSAL

## 2015-03-02 NOTE — [place] FL2 (Signed)
MEDICAID FL2 LEVEL OF CARE SCREENING TOOL     IDENTIFICATION  Patient Name: Regina Fields Birthdate: 1928-11-18 Sex: female Admission Date (Current Location): 03/01/2015  [place] and [place] Number:  Chiropodist and Address:  [facility], [address], [place], [place] [address]      Provider Number: [id number]  Attending Physician Name and Address:  Enedina Finner, MD  Relative Name and Phone Number:       Current Level of Care: Hospital Recommended Level of Care: Skilled Nursing Facility Prior Approval Number:    Date Approved/Denied:   PASRR Number:  ( [id number] )  Discharge Plan: SNF    Current Diagnoses: Patient Active Problem List   Diagnosis Date Noted  . Acute respiratory failure (HCC) 03/01/2015    Orientation RESPIRATION BLADDER Height & Weight    Self  O2 (2 Liters Oxygen ) Continent   139 lbs.  BEHAVIORAL SYMPTOMS/MOOD NEUROLOGICAL BOWEL NUTRITION STATUS   (none )  (none ) Continent Diet (Diet: 2 grams sodium. )  AMBULATORY STATUS COMMUNICATION OF NEEDS Skin   Extensive Assist Verbally Normal                       Personal Care Assistance Level of Assistance  Bathing, Feeding, Dressing Bathing Assistance: Limited assistance Feeding assistance: Limited assistance Dressing Assistance: Limited assistance     Functional Limitations Info  Hearing, Sight, Speech Sight Info: Adequate Hearing Info: Adequate Speech Info: Adequate    SPECIAL CARE FACTORS FREQUENCY                       Contractures      Additional Factors Info  Code Status, Allergies Code Status Info:  (Full Code. ) Allergies Info:  (Alendronate, Candesartan, Amoxicillin, Atorvastatin, Ceftriaxone, Levofloxacin, Lisinopril, Losartan, Nizatidine, Nsaids, Omeprazole Magnesium, Psyllium, Sertaconazole, Sulfa Antibiotics)           Current Medications (03/02/2015):  This is the current hospital active medication  list Current Facility-Administered Medications  Medication Dose Route Frequency Provider Last Rate Last Dose  . acetaminophen (TYLENOL) tablet 650 mg  650 mg Oral Q6H PRN Katha Hamming, MD       Or  . acetaminophen (TYLENOL) suppository 650 mg  650 mg Rectal Q6H PRN Katha Hamming, MD      . antiseptic oral rinse (CPC / CETYLPYRIDINIUM CHLORIDE 0.05%) solution 7 mL  7 mL Mouth Rinse BID Katha Hamming, MD      . azithromycin (ZITHROMAX) 500 mg in dextrose 5 % 250 mL IVPB  500 mg Intravenous Q24H Katha Hamming, MD 250 mL/hr at 03/01/15 2159 500 mg at 03/01/15 2159  . azithromycin (ZITHROMAX) tablet 500 mg  500 mg Oral Once Minna Antis, MD   500 mg at 03/01/15 1844  . bisacodyl (DULCOLAX) EC tablet 5 mg  5 mg Oral Daily PRN Katha Hamming, MD      . cefTRIAXone (ROCEPHIN) 1 g in dextrose 5 % 50 mL IVPB  1 g Intravenous Q24H Katha Hamming, MD   1 g at 03/01/15 2346  . docusate sodium (COLACE) capsule 100 mg  100 mg Oral BID Katha Hamming, MD   100 mg at 03/02/15 0950  . heparin injection 5,000 Units  5,000 Units Subcutaneous 3 times per day Katha Hamming, MD   5,000 Units at 03/02/15 1353  . HYDROcodone-acetaminophen (NORCO/VICODIN) 5-325 MG per tablet 1-2 tablet  1-2 tablet Oral Q4H PRN Katha Hamming, MD  1 tablet at 03/02/15 0107  . ipratropium-albuterol (DUONEB) 0.5-2.5 (3) MG/3ML nebulizer solution 3 mL  3 mL Nebulization Q6H Katha Hamming, MD   3 mL at 03/02/15 0724  . ondansetron (ZOFRAN) tablet 4 mg  4 mg Oral Q6H PRN Katha Hamming, MD       Or  . ondansetron (ZOFRAN) injection 4 mg  4 mg Intravenous Q6H PRN Katha Hamming, MD         Discharge Medications: Please see discharge summary for a list of discharge medications.  Relevant Imaging Results:  Relevant Lab Results:   Additional Information  (SSN: [id number])  Haig Prophet, LCSW

## 2015-03-02 NOTE — Clinical Social Work Note (Signed)
Clinical Social Work Assessment  Patient Details  Name: Regina Fields MRN: [id number] Date of Birth: 1928/08/27  Date of referral:  03/02/15               Reason for consult:  Facility Placement, Other (Comment Required) (From [facility] (SNF) )                Permission sought to share information with:  Chartered certified accountant granted to share information::  Yes, Verbal Permission Granted  Name::      Retail buyer::   [place]   Relationship::     Contact Information:     Housing/Transportation Living arrangements for the past 2 months:  [place] of Information:  Patient, Other (Comment Required) (Daughter in Arboriculturist. ) Patient Interpreter Needed:  None Criminal Activity/Legal Involvement Pertinent to Current Situation/Hospitalization:  No - Comment as needed Significant Relationships:  Adult Children, Other Family Members Lives with:  Facility Resident Do you feel safe going back to the place where you live?  Yes Need for family participation in patient care:  Yes (Comment)  Care giving concerns: Patient is a long term care resident at [facility].    Social Worker assessment / plan:  Holiday representative ([place]) received verbal consult from MD that patient is from [facility] and will be ready to D/C tomorrow. CSW met with patient to address consult. Patient was oriented to self only. Patient did verbalize that she is from [facility] and reported that it is wonderful. CSW contacted patient's son Regina Fields and spoke to his wife Regina Fields. Per Milon Score and her share HPOA for patient. Per Regina Fields they live in [place]. Regina Fields reported that patient has lived at [facility] since the 1990's and moved into the SNF 3 years ago. Per Regina Fields the plan is for patient to return to [facility].   FL2 complete and faxed out.   Employment status:  Retired Forensic scientist:  Medicare PT Recommendations:  No Follow Up Information / Referral to  community resources:  [place]  Patient/Family's Response to care:  Patient and daughter in Sports coach Regina Fields are agreeable for patient to return to [facility].   Patient/Family's Understanding of and Emotional Response to Diagnosis, Current Treatment, and Prognosis: Daughter in law Regina Fields thanked CSW for calling.   Emotional Assessment Appearance:  Appears stated age Attitude/Demeanor/Rapport:    Affect (typically observed):  Accepting, Pleasant Orientation:  Oriented to Self, Fluctuating Orientation (Suspected and/or reported Sundowners) Alcohol / Substance use:  Not Applicable Psych involvement (Current and /or in the community):  No (Comment)  Discharge Needs  Concerns to be addressed:  Discharge Planning Concerns Readmission within the last 30 days:  No Current discharge risk:  Cognitively Impaired, Chronically ill Barriers to Discharge:  Continued Medical Work up   Elwyn Reach 03/02/2015, 2:37 PM

## 2015-03-02 NOTE — Progress Notes (Signed)
[facility] Physicians - [place] at [facility]   PATIENT NAME: Regina Fields    MR#:  [id number]  DATE OF BIRTH:  27-Jun-1928  SUBJECTIVE:  Came in and from the legs with increasing shortness of breath cough and hypoxia. Doing well today. Eating breakfast fed by nurse in the morning. Denies much complaints. Does have some white cough  REVIEW OF SYSTEMS:   Review of Systems  Unable to perform ROS: dementia   yes Tolerating Diet: Tolerating PT: No PT needs per therapist  DRUG ALLERGIES:   Allergies  Allergen Reactions  . Alendronate Shortness Of Breath  . Candesartan Shortness Of Breath  . Amoxicillin Diarrhea  . Atorvastatin     Other reaction(s): Muscle Pain  . Ceftriaxone     Other reaction(s): Muscle Pain  . Levofloxacin     Other reaction(s): Unknown  . Lisinopril     Other reaction(s): Unknown  . Losartan     Other reaction(s): Unknown  . Nizatidine     Other reaction(s): Other (See Comments) Blurred vision  . Nsaids     Other reaction(s): Unknown  . Omeprazole Magnesium Nausea Only  . Psyllium     Other reaction(s): Unknown  . Sertaconazole     Other reaction(s): Unknown  . Sulfa Antibiotics Rash    VITALS:  Blood pressure 121/53, pulse 73, temperature 98.7 F (37.1 C), temperature source Oral, resp. rate 17, weight 63.458 kg (139 lb 14.4 oz), SpO2 96 %.  PHYSICAL EXAMINATION:   Physical Exam  GENERAL:  82 y.o.-year-old patient lying in the bed with no acute distress.  EYES: Pupils equal, round, reactive to light and accommodation. No scleral icterus. Extraocular muscles intact.  HEENT: Head atraumatic, normocephalic. Oropharynx and nasopharynx clear.  NECK:  Supple, no jugular venous distention. No thyroid enlargement, no tenderness.  LUNGS: Coarse breath sounds bilaterally, no wheezing, rales, rhonchi. No use of accessory muscles of respiration.  CARDIOVASCULAR: S1, S2 normal. No murmurs, rubs, or gallops.  ABDOMEN: Soft, nontender,  nondistended. Bowel sounds present. No organomegaly or mass.  EXTREMITIES: No cyanosis, clubbing or edema b/l.    NEUROLOGIC: Cranial nerves II through XII are intact. No focal Motor or sensory deficits b/l.   PSYCHIATRIC:  patient is alert and oriented x 3.  SKIN: No obvious rash, lesion, or ulcer.   LABORATORY PANEL:  CBC  Recent Labs Lab 03/02/15 0535  WBC 11.7*  HGB 11.2*  HCT 33.7*  PLT 129*    Chemistries   Recent Labs Lab 03/01/15 1518 03/02/15 0535  NA 137 137  K 3.2* 3.6  CL 98* 99*  CO2 31 30  GLUCOSE 104* 89  BUN 25* 23*  CREATININE 1.29* 1.25*  CALCIUM 8.9 8.4*  AST 35  --   ALT 22  --   ALKPHOS 74  --   BILITOT 0.5  --    Cardiac Enzymes  Recent Labs Lab 03/01/15 1518  TROPONINI <0.03   RADIOLOGY:  Dg Chest 2 View  03/01/2015  CLINICAL DATA:  Shortness of breath starting this morning, dementia EXAM: CHEST  2 VIEW COMPARISON:  06/20/2011 FINDINGS: Cardiomediastinal silhouette is stable. Mild interstitial prominence bilateral without convincing pulmonary edema. Metallic fixation plate and screws in proximal left humerus again noted. No segmental infiltrate. Osteopenia and degenerative changes thoracic spine. Again noted significant compression deformity upper lumbar spine stable. IMPRESSION: Mild interstitial prominence bilateral without convincing pulmonary edema. No segmental infiltrate. Osteopenia and mild degenerative changes thoracic spine. Electronically Signed   By: Lang Snow  Pop M.D.   On: 03/01/2015 15:53   ASSESSMENT AND PLAN:  Regina Fields is a 82 y.o. female with a known history of dementia brought in from [facility] because of shortness of breath, increased respiratory rate up to 30s, hypoxia with O2 sats 90% on room air  1. Acute hypoxic respiratory failure likely secondary to bronchitis - continue oxygen, started on Rocephin, Zithromax. Continue nebulizers. - Likely discharge tomorrow back to [facility] [place]  #2 hypokalemia replace the  potassium.  #3 mild acute renal failure likely ATN: She doesn't look dehydrated. Does have Leg edema  #4. Dementia chronic  Case discussed with Care Management/Social Worker.  CODE STATUS: Full  DVT Prophylaxis: Lovenox  TOTAL TIME TAKING CARE OF THIS PATIENT: 35 minutes.  >50% time spent on counselling and coordination of care  POSSIBLE D/C IN one DAYS, DEPENDING ON CLINICAL CONDITION.  Note: This dictation was prepared with Dragon dictation along with smaller phrase technology. Any transcriptional errors that result from this process are unintentional.  Regina Fields M.D on 03/02/2015 at 1:10 PM  Between 7am to 6pm - Pager - [phone]  After 6pm go to [email or web address] - password [email or web address] [facility]  [place] [facility]  Office  [phone]  CC: Primary care physician; No primary care provider on file.

## 2015-03-02 NOTE — Evaluation (Signed)
Physical Therapy Evaluation Patient Details Name: Regina Fields MRN: [id number] DOB: 03/07/1928 Today's Date: 03/02/2015   History of Present Illness  presented to ER from [facility] due to SOB, increased RR; admitted with acute hypoxic respiratory failure related to bronchitis. Currently on 2L supplemental O2.  Clinical Impression  Upon evaluation, patient alert and oriented to self only; follows simple commands generally 25% of time, often requiring demonstration or hand-over-hand assist from therapist to complete.  Bilat UE/LE strength and ROM globally deconditioned, strength at least 3-/5 (with bilat ankles lacking passive DF to neutral).  Currently requiring dep assist from therapist for all bed mobility and unsupported sitting balance attempts.  Persistent R posterior/lateral lean with all sitting efforts, requiring hands-on assist from therapist at all times to prevent LOB/fall (absent righting reactions).  Unsafe/unable to attempt additional mobility or OOB at this time. Per physician, patient resident of LTC/memory care at [facility] (facility unavailable per phone contact to confirm PLOF).  Appears patient likely at baseline level of functional ability; limited ability to actively participate/progress with skilled PT services at this time.  Will discontinue initial PT order; please re-consult should needs change. Do recommend total care and use of mechanical lift for all OOB attempts at this time.    Follow Up Recommendations No PT follow up    Equipment Recommendations       Recommendations for Other Services       Precautions / Restrictions Precautions Precautions: Fall Restrictions Weight Bearing Restrictions: No      Mobility  Bed Mobility Overal bed mobility: Needs Assistance Bed Mobility: Supine to Sit;Sit to Supine     Supine to sit: Total assist Sit to supine: Total assist      Transfers                 General transfer comment: unable to maintain  unsupported sitting balance without max/dep assist from therapist; OOB attempts unsafe at this time  Ambulation/Gait                Stairs            Wheelchair Mobility    Modified Rankin (Stroke Patients Only)       Balance Overall balance assessment: Needs assistance Sitting-balance support: Bilateral upper extremity supported Sitting balance-Leahy Scale: Poor Sitting balance - Comments: R posterior/lateral lean with no spontaneous righting reactions noted; constant assist from therapist to prevent LOB/fall Postural control: Right lateral lean                                   Pertinent Vitals/Pain Pain Assessment: No/denies pain    Home Living Family/patient expects to be discharged to:: Skilled nursing facility                 Additional Comments: Patient unable to provide information due to cognitive deficits/dementia. Per MD, from LTC/memory care.    Prior Function Level of Independence: Needs assistance         Comments: Patient unable to provide information.  Attempted to phone facility to verify PLOF, but unavailable.     Hand Dominance        Extremity/Trunk Assessment   Upper Extremity Assessment: Generalized weakness (grossly at least 3-/5 throughout (requires demonstration and hand-over-hand) from therapist for task comprehension)           Lower Extremity Assessment: Generalized weakness (globally 3-/5 throughout, bilat ankles (L > R)  lacking approx 5-8 degrees passive DF)         Communication   Communication: No difficulties  Cognition Arousal/Alertness: Awake/alert Behavior During Therapy: WFL for tasks assessed/performed Overall Cognitive Status: Difficult to assess (oriented to self only; follows simple commands only approx 25% time.  Limited ability to process, carry-over new information)                      General Comments      Exercises        Assessment/Plan    PT Assessment  Patent does not need any further PT services  PT Diagnosis     PT Problem List    PT Treatment Interventions     PT Goals (Current goals can be found in the Care Plan section) Acute Rehab PT Goals PT Goal Formulation: Patient unable to participate in goal setting    Frequency     Barriers to discharge        Co-evaluation               End of Session   Activity Tolerance:  (limited by ability to actively participate/progress and actively comprehend goals of therapy) Patient left: in bed;with call bell/phone within reach;with bed alarm set           Time: 1057-1111 PT Time Calculation (min) (ACUTE ONLY): 14 min   Charges:   PT Evaluation $PT Eval Low Complexity: 1 Procedure     PT G Codes:        Znya Albino H. Manson Passey, PT, DPT, NCS 03/02/2015, 12:46 PM [phone]

## 2015-03-03 ENCOUNTER — Inpatient Hospital Stay: Payer: Medicare Other

## 2015-03-03 LAB — GLUCOSE, CAPILLARY: Glucose-Capillary: 109 mg/dL — ABNORMAL HIGH (ref 65–99)

## 2015-03-03 MED ORDER — FUROSEMIDE 10 MG/ML IJ SOLN
20.0000 mg | Freq: Once | INTRAMUSCULAR | Status: AC
  Administered 2015-03-03: 20 mg via INTRAVENOUS
  Filled 2015-03-03: qty 2

## 2015-03-03 MED ORDER — METHYLPREDNISOLONE SODIUM SUCC 125 MG IJ SOLR
80.0000 mg | Freq: Once | INTRAMUSCULAR | Status: AC
  Administered 2015-03-03: 80 mg via INTRAVENOUS
  Filled 2015-03-03: qty 2

## 2015-03-03 NOTE — Progress Notes (Signed)
[facility] Physicians - [place] at [facility]   PATIENT NAME: Regina Fields    MR#:  [id number]  DATE OF BIRTH:  05-Jun-1928  SUBJECTIVE:  Came in and from the legs with increasing shortness of breath cough and hypoxia. Poor cough reflex. Patient has chronic cough. Unable to bring up phlegm. Tachypneic REVIEW OF SYSTEMS:   Review of Systems  Unable to perform ROS: dementia   yes Tolerating Diet: Tolerating PT: No PT needs per therapist  DRUG ALLERGIES:   Allergies  Allergen Reactions  . Alendronate Shortness Of Breath  . Candesartan Shortness Of Breath  . Amoxicillin Diarrhea  . Atorvastatin     Other reaction(s): Muscle Pain  . Ceftriaxone     Other reaction(s): Muscle Pain  . Levofloxacin     Other reaction(s): Unknown  . Lisinopril     Other reaction(s): Unknown  . Losartan     Other reaction(s): Unknown  . Nizatidine     Other reaction(s): Other (See Comments) Blurred vision  . Nsaids     Other reaction(s): Unknown  . Omeprazole Magnesium Nausea Only  . Psyllium     Other reaction(s): Unknown  . Sertaconazole     Other reaction(s): Unknown  . Sulfa Antibiotics Rash    VITALS:  Blood pressure 121/55, pulse 99, temperature 98.8 F (37.1 C), temperature source Oral, resp. rate 32, weight 63.912 kg (140 lb 14.4 oz), SpO2 95 %.  PHYSICAL EXAMINATION:   Physical Exam  GENERAL:  82 y.o.-year-old patient lying in the bed with no acute distress.  EYES: Pupils equal, round, reactive to light and accommodation. No scleral icterus. Extraocular muscles intact.  HEENT: Head atraumatic, normocephalic. Oropharynx and nasopharynx clear.  NECK:  Supple, no jugular venous distention. No thyroid enlargement, no tenderness.  LUNGS: Coarse breath sounds bilaterally, no wheezing, rales, rhonchi. No use of accessory muscles of respiration.  CARDIOVASCULAR: S1, S2 normal. No murmurs, rubs, or gallops.  ABDOMEN: Soft, nontender, nondistended. Bowel sounds present.  No organomegaly or mass.  EXTREMITIES: No cyanosis, clubbing or edema b/l.    NEUROLOGIC: Cranial nerves II through XII are intact. No focal Motor or sensory deficits b/l.   PSYCHIATRIC:  patient is alert and oriented x 3.  SKIN: No obvious rash, lesion, or ulcer.   LABORATORY PANEL:  CBC  Recent Labs Lab 03/02/15 0535  WBC 11.7*  HGB 11.2*  HCT 33.7*  PLT 129*    Chemistries   Recent Labs Lab 03/01/15 1518 03/02/15 0535  NA 137 137  K 3.2* 3.6  CL 98* 99*  CO2 31 30  GLUCOSE 104* 89  BUN 25* 23*  CREATININE 1.29* 1.25*  CALCIUM 8.9 8.4*  AST 35  --   ALT 22  --   ALKPHOS 74  --   BILITOT 0.5  --    Cardiac Enzymes  Recent Labs Lab 03/01/15 1518  TROPONINI <0.03   RADIOLOGY:  Dg Chest 1 View  03/03/2015  CLINICAL DATA:  82 year old nursing home patient admitted 2 days ago with shortness of breath and tachypnea. Worsening shortness of breath associated with wheezing today. EXAM: Portable CHEST 1 VIEW COMPARISON:  03/01/2015, 06/20/2011 and earlier, including CT chest 09/11/2010. FINDINGS: Suboptimal inspiration accounts for crowded bronchovascular markings, especially in the bases, and accentuates the cardiac silhouette. Taking this into account, cardiac silhouette upper normal in size to slightly enlarged for technique. Prominent bronchovascular markings and moderate central peribronchial thickening, slightly progressive since the examination 2 days ago and certainly progressive since the  2013 examination. Linear atelectasis at the right lung base. Lungs otherwise clear. No localized airspace consolidation. No pleural effusions. No pneumothorax. Normal pulmonary vascularity. IMPRESSION: Moderate changes of acute bronchitis and/or asthma without focal airspace pneumonia. Suboptimal inspiration accounts for mild right basilar atelectasis. Electronically Signed   By: Hulan Saas M.D.   On: 03/03/2015 13:47   ASSESSMENT AND PLAN:  Regina Fields is a 82 y.o. female  with a known history of dementia brought in from [facility] [place] because of shortness of breath, increased respiratory rate up to 30s, hypoxia with O2 sats 90% on room air  1. Acute hypoxic respiratory failure likely secondary to bronchitis - continue oxygen, started on Rocephin, Zithromax. Continue nebulizers. -We'll wean her to room air. I will give her 1 dose of IV Solu-Medrol 80 mg 1. Continue nebulizers and inhalers. - Likely discharge tomorrow back to [facility] [place]  #2 hypokalemia replace the potassium.  #3 mild acute renal failure likely ATN: She doesn't look dehydrated. Does have Leg edema  #4. Dementia chronic  Case discussed with Care Management/Social Worker.  CODE STATUS: Full  DVT Prophylaxis: Lovenox  TOTAL TIME TAKING CARE OF THIS PATIENT: 35 minutes.  >50% time spent on counselling and coordination of care  POSSIBLE D/C IN one DAYS, DEPENDING ON CLINICAL CONDITION.  Note: This dictation was prepared with Dragon dictation along with smaller phrase technology. Any transcriptional errors that result from this process are unintentional.  Regina Fields M.D on 03/03/2015 at 4:16 PM  Between 7am to 6pm - Pager - [phone]  After 6pm go to [email or web address] - password [email or web address] [facility]  [place] [facility]  Office  [phone]  CC: Primary care physician; No primary care provider on file.

## 2015-03-03 NOTE — Clinical Social Work Note (Signed)
MD stated that patient will more than likely be ready to discharge tomorrow to return to [facility]. CSW has updated Sue Lush at [facility]. York Spaniel MSW,LCSW [phone]

## 2015-03-03 NOTE — Care Management Important Message (Signed)
Important Message  Patient Details  Name: Regina Fields MRN: [id number] Date of Birth: 1928/08/13   Medicare Important Message Given:  Yes    Olegario Messier A Guillermo Difrancesco 03/03/2015, 10:35 AM

## 2015-03-03 NOTE — Progress Notes (Addendum)
Initial Nutrition Assessment       INTERVENTION:  Meals and snacks: Cater to pt preferences Medical Nutritional Supplement Therapy: Will add mightyshake BID for added nutrition   NUTRITION DIAGNOSIS:   Increased nutrient needs related to acute illness as evidenced by estimated needs.    GOAL:   Patient will meet greater than or equal to 90% of their needs    MONITOR:    (Energy intake)  REASON FOR ASSESSMENT:   Malnutrition Screening Tool    ASSESSMENT:      Pt admitted with pneumonia  Past Medical History  Diagnosis Date  . Dementia     Current Nutrition: no recorded intake for breakfast this am,  Pt unable to answer questions  Food/Nutrition-Related History: pt unable to report intake prior to admission   Scheduled Medications:  . antiseptic oral rinse  7 mL Mouth Rinse BID  . azithromycin  500 mg Intravenous Q24H  . azithromycin  500 mg Oral Once  . cefTRIAXone (ROCEPHIN)  IV  1 g Intravenous Q24H  . docusate sodium  100 mg Oral BID  . heparin  5,000 Units Subcutaneous 3 times per day  . ipratropium-albuterol  3 mL Nebulization Q6H        Electrolyte/Renal Profile and Glucose Profile:   Recent Labs Lab 03/01/15 1518 03/02/15 0535  NA 137 137  K 3.2* 3.6  CL 98* 99*  CO2 31 30  BUN 25* 23*  CREATININE 1.29* 1.25*  CALCIUM 8.9 8.4*  GLUCOSE 104* 89   Protein Profile:  Recent Labs Lab 03/01/15 1518  ALBUMIN 4.1    Gastrointestinal Profile: Last BM:1/23   Nutrition-Focused Physical Exam Findings: Nutrition-Focused physical exam completed. Findings are WDL for fat depletion, muscle depletion, and edema. .   Weight Change: unsure regarding wt change    Diet Order:  Diet 2 gram sodium Room service appropriate?: Yes; Fluid consistency:: Thin  Skin:   reviewed   Height:   Ht Readings from Last 1 Encounters:  No data found for Ht    Weight:   Wt Readings from Last 1 Encounters:  03/03/15 140 lb 14.4 oz (63.912 kg)     Ideal Body Weight:     BMI:  There is no height on file to calculate BMI.  Estimated Nutritional Needs:   Kcal:  (25-30 kcals/kg) 1600-1920 kcals/d. No ht available  Protein:  (1.0-1.2 g/kg) 64-77 g/d  Fluid:  (25-56ml/kg) 1600-193ml/d  EDUCATION NEEDS:   No education needs identified at this time   MODERATE Care Level  Kensli Bowley B. Freida Busman, RD, LDN [phone] (pager) Weekend/On-Call pager [phone])

## 2015-03-03 NOTE — Care Management (Signed)
Patient admitted from SNF.  CSW following.  RNCM signing off

## 2015-03-04 ENCOUNTER — Encounter: Payer: Self-pay | Admitting: *Deleted

## 2015-03-04 MED ORDER — AZITHROMYCIN 250 MG PO TABS
500.0000 mg | ORAL_TABLET | Freq: Every day | ORAL | Status: DC
  Administered 2015-03-04 – 2015-03-05 (×2): 500 mg via ORAL
  Filled 2015-03-04 (×2): qty 2

## 2015-03-04 NOTE — Progress Notes (Signed)
[facility] [place] at [facility]   PATIENT NAME: Regina Fields    MR#:  [id number]  DATE OF BIRTH:  Oct 24, 1928  SUBJECTIVE:  Not feeling well today, still c/o shortness of breath, cough - hypoxic on 3 liters O2 when I saw Poor cough reflex. Patient has chronic cough. Unable to bring up phlegm. Tachypneic REVIEW OF SYSTEMS:   Review of Systems  Unable to perform ROS: dementia   Tolerating Diet: yes but food spilled all over her  Tolerating PT: No PT needs per therapist - wheel chair bound  DRUG ALLERGIES:   Allergies  Allergen Reactions  . Alendronate Shortness Of Breath  . Candesartan Shortness Of Breath  . Amoxicillin Diarrhea  . Atorvastatin     Other reaction(s): Muscle Pain  . Ceftriaxone     Other reaction(s): Muscle Pain  . Levofloxacin     Other reaction(s): Unknown  . Lisinopril     Other reaction(s): Unknown  . Losartan     Other reaction(s): Unknown  . Nizatidine     Other reaction(s): Other (See Comments) Blurred vision  . Nsaids     Other reaction(s): Unknown  . Omeprazole Magnesium Nausea Only  . Psyllium     Other reaction(s): Unknown  . Sertaconazole     Other reaction(s): Unknown  . Sulfa Antibiotics Rash    VITALS:  Blood pressure 128/63, pulse 103, temperature 98.6 F (37 C), temperature source Oral, resp. rate 17, height  (1.626 m), weight 63.458 kg (139 lb 14.4 oz), SpO2 94 %.  PHYSICAL EXAMINATION:   Physical Exam  GENERAL:  83 y.o.-year-old patient lying in the bed with no acute distress.  EYES: Pupils equal, round, reactive to light and accommodation. No scleral icterus. Extraocular muscles intact.  HEENT: Head atraumatic, normocephalic. Oropharynx and nasopharynx clear.  NECK:  Supple, no jugular venous distention. No thyroid enlargement, no tenderness.  LUNGS: Coarse breath sounds bilaterally, no wheezing, rales, rhonchi. No use of accessory muscles of respiration.  CARDIOVASCULAR: S1, S2 normal. No  murmurs, rubs, or gallops.  ABDOMEN: Soft, nontender, nondistended. Bowel sounds present. No organomegaly or mass.  EXTREMITIES: No cyanosis, clubbing or edema b/l.    NEUROLOGIC: Cranial nerves II through XII are intact. No focal Motor or sensory deficits b/l.   PSYCHIATRIC:  patient is alert and pleasantly confused.  SKIN: No obvious rash, lesion, or ulcer.   LABORATORY PANEL:  CBC  Recent Labs Lab 03/02/15 0535  WBC 11.7*  HGB 11.2*  HCT 33.7*  PLT 129*    Chemistries   Recent Labs Lab 03/01/15 1518 03/02/15 0535  NA 137 137  K 3.2* 3.6  CL 98* 99*  CO2 31 30  GLUCOSE 104* 89  BUN 25* 23*  CREATININE 1.29* 1.25*  CALCIUM 8.9 8.4*  AST 35  --   ALT 22  --   ALKPHOS 74  --   BILITOT 0.5  --    Cardiac Enzymes  Recent Labs Lab 03/01/15 1518  TROPONINI <0.03   RADIOLOGY:  Dg Chest 1 View  03/03/2015  CLINICAL DATA:  82 year old nursing home patient admitted 2 days ago with shortness of breath and tachypnea. Worsening shortness of breath associated with wheezing today. EXAM: Portable CHEST 1 VIEW COMPARISON:  03/01/2015, 06/20/2011 and earlier, including CT chest 09/11/2010. FINDINGS: Suboptimal inspiration accounts for crowded bronchovascular markings, especially in the bases, and accentuates the cardiac silhouette. Taking this into account, cardiac silhouette upper normal in size to slightly enlarged for technique. Prominent  bronchovascular markings and moderate central peribronchial thickening, slightly progressive since the examination 2 days ago and certainly progressive since the 2013 examination. Linear atelectasis at the right lung base. Lungs otherwise clear. No localized airspace consolidation. No pleural effusions. No pneumothorax. Normal pulmonary vascularity. IMPRESSION: Moderate changes of acute bronchitis and/or asthma without focal airspace pneumonia. Suboptimal inspiration accounts for mild right basilar atelectasis. Electronically Signed   By: Hulan Saas M.D.   On: 03/03/2015 13:47   ASSESSMENT AND PLAN:  Regina Fields is a 82 y.o. female with a known history of dementia brought in from [facility] because of shortness of breath, increased respiratory rate up to 30s, hypoxia with O2 sats 90% on room air  1. Acute hypoxic respiratory failure likely secondary to bronchitis/pneumonia - continue oxygen, started on Rocephin, Zithromax. Continue nebulizers. -We'll wean her to room air. Continue nebulizers and inhalers. - Likely discharge tomorrow back to [facility]  #2 hypokalemia repleted and resolved  #3 mild acute renal failure likely ATN: She doesn't look dehydrated. Does have Leg edema  #4. Dementia chronic  Case discussed with Care Management/Social Worker.  CODE STATUS: Full  DVT Prophylaxis: Lovenox  TOTAL TIME TAKING CARE OF THIS PATIENT: 35 minutes.   >50% time spent on counselling and coordination of care  POSSIBLE D/C IN AM, DEPENDING ON CLINICAL CONDITION.  Note: This dictation was prepared with Dragon dictation along with smaller phrase technology. Any transcriptional errors that result from this process are unintentional.  [facility], Natasha Paulson M.D on 03/04/2015 at 9:46 PM  Between 7am to 6pm - Pager - [phone]  After 6pm go to [email or web address] - password [email or web address] [facility]  [place]  [facility]  Office  [phone]  CC: Primary care physician; No primary care provider on file.

## 2015-03-04 NOTE — Plan of Care (Signed)
Problem: Pain Managment: Goal: General experience of comfort will improve Outcome: Progressing Pt has remained free from pain during my care.   Problem: Skin Integrity: Goal: Risk for impaired skin integrity will decrease Outcome: Progressing Pt's skin has remained intact during my care.   Problem: Nutrition: Goal: Adequate nutrition will be maintained Outcome: Progressing Pt has maintained her appetite during my shift and only requires set up for meals. Pt can feed herself.

## 2015-03-04 NOTE — Plan of Care (Signed)
Problem: Education: Goal: Knowledge of [place] Education information/materials will improve Outcome: Not Progressing Patient has dementia.  Problem: Health Behavior/Discharge Planning: Goal: Ability to manage health-related needs will improve Outcome: Not Progressing Patient needs assistance from family due to dementia.  Problem: Activity: Goal: Risk for activity intolerance will decrease Outcome: Not Progressing Patient is wheelchair bound.

## 2015-03-05 LAB — CBC
HCT: 35.3 % (ref 35.0–47.0)
HEMOGLOBIN: 11.6 g/dL — AB (ref 12.0–16.0)
MCH: 27 pg (ref 26.0–34.0)
MCHC: 33 g/dL (ref 32.0–36.0)
MCV: 81.8 fL (ref 80.0–100.0)
PLATELETS: 155 10*3/uL (ref 150–440)
RBC: 4.32 MIL/uL (ref 3.80–5.20)
RDW: 14.8 % — ABNORMAL HIGH (ref 11.5–14.5)
WBC: 12.1 10*3/uL — ABNORMAL HIGH (ref 3.6–11.0)

## 2015-03-05 LAB — GLUCOSE, CAPILLARY: GLUCOSE-CAPILLARY: 106 mg/dL — AB (ref 65–99)

## 2015-03-05 MED ORDER — AZITHROMYCIN 250 MG PO TABS: ORAL_TABLET | ORAL | Status: DC

## 2015-03-05 NOTE — Clinical Social Work Note (Signed)
Patient discharging to return today to [facility]. CSW has notified and spoken to patient's son: Mr. Benge and he is aware. Patient will transport via EMS. Sue Lush at [facility] is aware and discharge information sent. Nurse to call report.  York Spaniel MSW,LCSW [phone]

## 2015-03-05 NOTE — Care Management Important Message (Signed)
Important Message  Patient Details  Name: Regina Fields MRN: [id number] Date of Birth: 12-11-1928   Medicare Important Message Given:  Yes    Olegario Messier A Simya Tercero 03/05/2015, 10:20 AM

## 2015-03-05 NOTE — Progress Notes (Signed)
Report called to Dena at [facility]. EMS has been called and pt is awaiting transport. VSS.

## 2015-03-05 NOTE — Discharge Summary (Signed)
[name], is a 82 y.o. female  DOB October 25, 1928  MRN [id number].  Admission date:  03/01/2015  Admitting Physician  Katha Hamming, MD  Discharge Date:  03/05/2015   Primary MD  No primary care provider on file. Dictated Dr. is Jerl Mina Recommendations for primary care physician for things to follow:     Admission Diagnosis  Dyspnea [R06.00] URI (upper respiratory infection) [J06.9]   Discharge Diagnosis  Dyspnea [R06.00] URI (upper respiratory infection) [J06.9]    Active Problems:   Acute respiratory failure [facility])      Past Medical History  Diagnosis Date  . Dementia     History reviewed. No pertinent past surgical history.     History of present illness and  Hospital Course:     Kindly see H&P for history of present illness and admission details, please review complete Labs, Consult reports and Test reports for all details in brief  HPI  from the history and physical done on the day of admission 82 year old female patient with dementia brought in from clinic secondary to shortness of breath, tachypnea with respiratory rate upper 30s with hypoxia sats of 90% on room air. Because of that  she is admitted for acute hypoxic respiratory failure and possible bronchitis. Patient has severe dementia unable to give any history   Hospital Course  #1 acute hypoxic respiratory failure secondary to bronchitis: Continued on oxygen, started on Rocephin, Zithromax. And nebulizers were given. WBC on admission 14.1 , today it is 12.1. Patient chest x-ray initially did not show pneumonia but the following x-ray showed possible right-sided infiltrate. . Patient hypoxia resolved, she is saturating 92% on room air. No obvious respiratory distress. Comfortably breathing. Stable for discharge to [facility].  Use  albuterol inhaler as needed for wheezing. #2 hypokalemia replaced. #3 mild acute renal failure due to ATN: Improved. 4.Severe dementia. #5 hypertension #6 hypothyroidism #7 chronic leg edema and takes . Lasix and potassium. Stable for discharge to [facility]   Discharge Condition: stable   Follow UP      Discharge Instructions  and  Discharge Medications        Medication List    STOP taking these medications        FIRST-DUKES MOUTHWASH Susp      TAKE these medications        acetaminophen 325 MG tablet  Commonly known as:  TYLENOL  Take 650 mg by mouth every 4 (four) hours as needed for mild pain, moderate pain or fever.     albuterol 108 (90 Base) MCG/ACT inhaler  Commonly known as:  PROVENTIL HFA;VENTOLIN HFA  Inhale 2 puffs into the lungs every 4 (four) hours as needed for wheezing.     amLODipine 5 MG tablet  Commonly known as:  NORVASC  Take 5 mg by mouth daily.     aspirin 81 MG chewable tablet  Chew 81 mg by mouth daily.     atenolol 25 MG tablet  Commonly known as:  TENORMIN  Take 25 mg by mouth daily.     azithromycin 250 MG tablet  Commonly known as:  ZITHROMAX  Po daily for 5 days     furosemide 40 MG tablet  Commonly known as:  LASIX  Take 40 mg by mouth daily.     levothyroxine 137 MCG tablet  Commonly known as:  SYNTHROID, LEVOTHROID  Take 137 mcg by mouth daily before breakfast.     montelukast 10 MG tablet  Commonly known  as:  SINGULAIR  Take 10 mg by mouth every evening.     NON FORMULARY  Take 120 mLs by mouth 2 (two) times daily. *Medpass 2.0*     potassium chloride 10 MEQ tablet  Commonly known as:  K-DUR  Take 10 mEq by mouth daily.     SALINE NASAL SPRAY NA  Place 2 sprays into the nose daily as needed (for allergies.).     THEREMS Tabs  Take 1 tablet by mouth daily.     traMADol 50 MG tablet  Commonly known as:  ULTRAM  Take 50 mg by mouth 2 (two) times daily.          Diet and Activity recommendation:  See Discharge Instructions above   Consults obtained - none   Major procedures and Radiology Reports - PLEASE review detailed and final reports for all details, in brief -     Dg Chest 1 View  03/03/2015  CLINICAL DATA:  82 year old nursing home patient admitted 2 days ago with shortness of breath and tachypnea. Worsening shortness of breath associated with wheezing today. EXAM: Portable CHEST 1 VIEW COMPARISON:  03/01/2015, 06/20/2011 and earlier, including CT chest 09/11/2010. FINDINGS: Suboptimal inspiration accounts for crowded bronchovascular markings, especially in the bases, and accentuates the cardiac silhouette. Taking this into account, cardiac silhouette upper normal in size to slightly enlarged for technique. Prominent bronchovascular markings and moderate central peribronchial thickening, slightly progressive since the examination 2 days ago and certainly progressive since the 2013 examination. Linear atelectasis at the right lung base. Lungs otherwise clear. No localized airspace consolidation. No pleural effusions. No pneumothorax. Normal pulmonary vascularity. IMPRESSION: Moderate changes of acute bronchitis and/or asthma without focal airspace pneumonia. Suboptimal inspiration accounts for mild right basilar atelectasis. Electronically Signed   By: Hulan Saas M.D.   On: 03/03/2015 13:47   Dg Chest 2 View  03/01/2015  CLINICAL DATA:  Shortness of breath starting this morning, dementia EXAM: CHEST  2 VIEW COMPARISON:  06/20/2011 FINDINGS: Cardiomediastinal silhouette is stable. Mild interstitial prominence bilateral without convincing pulmonary edema. Metallic fixation plate and screws in proximal left humerus again noted. No segmental infiltrate. Osteopenia and degenerative changes thoracic spine. Again noted significant compression deformity upper lumbar spine stable. IMPRESSION: Mild interstitial prominence bilateral without convincing pulmonary edema. No segmental infiltrate.  Osteopenia and mild degenerative changes thoracic spine. Electronically Signed   By: Natasha Mead M.D.   On: 03/01/2015 15:53    Micro Results    Recent Results (from the past 240 hour(s))  Culture, blood (routine x 2)     Status: None (Preliminary result)   Collection Time: 03/01/15  3:18 PM  Result Value Ref Range Status   Specimen Description BLOOD RIGHT ANTECUBITAL  Final   Special Requests BOTTLES DRAWN AEROBIC AND ANAEROBIC  1CC  Final   Culture NO GROWTH 3 DAYS  Final   Report Status PENDING  Incomplete  Culture, blood (routine x 2)     Status: None (Preliminary result)   Collection Time: 03/01/15  3:18 PM  Result Value Ref Range Status   Specimen Description BLOOD LEFT FOREARM  Final   Special Requests BOTTLES DRAWN AEROBIC AND ANAEROBIC  3CC  Final   Culture NO GROWTH 3 DAYS  Final   Report Status PENDING  Incomplete  Rapid Influenza A&B Antigens ([facility] only)     Status: None   Collection Time: 03/01/15  4:07 PM  Result Value Ref Range Status   Influenza A [facility]) NEGATIVE  Final  Influenza B [facility]) NEGATIVE  Final  MRSA PCR Screening     Status: None   Collection Time: 03/01/15 11:52 PM  Result Value Ref Range Status   MRSA by PCR NEGATIVE NEGATIVE Final    Comment:        The GeneXpert MRSA Assay (FDA approved for NASAL specimens only), is one component of a comprehensive MRSA colonization surveillance program. It is not intended to diagnose MRSA infection nor to guide or monitor treatment for MRSA infections.        Today   Subjective:   Regina Fields today has dementia, stable for discharge to [facility] today. Objective:   Blood pressure 140/66, pulse 94, temperature 98.4 F (36.9 C), temperature source Oral, resp. rate 18, height  (1.626 m), weight 63.458 kg (139 lb 14.4 oz), SpO2 92 %.   Intake/Output Summary (Last 24 hours) at 03/05/15 0924 Last data filed at 03/04/15 1300  Gross per 24 hour  Intake    240 ml  Output      0 ml  Net     240 ml    Exam Awake,but demented., No new F.N deficits, Normal affect [place].AT,PERRAL Supple Neck,No JVD, No cervical lymphadenopathy appriciated.  Symmetrical Chest wall movement, Good air movement bilaterally, CTAB RRR,No Gallops,Rubs or new Murmurs, No Parasternal Heave +ve B.Sounds, Abd Soft, Non tender, No organomegaly appriciated, No rebound -guarding or rigidity. No Cyanosis, Clubbing or edema, No new Rash or bruise  Data Review   CBC w Diff: Lab Results  Component Value Date   WBC 12.1* 03/05/2015   WBC 11.8* 06/22/2011   HGB 11.6* 03/05/2015   HGB 10.7* 06/22/2011   HCT 35.3 03/05/2015   HCT 32.1* 06/22/2011   PLT 155 03/05/2015   PLT 160 06/22/2011   LYMPHOPCT 22.000000 03/01/2015   LYMPHOPCT 19.3 06/22/2011   MONOPCT 19.000000 03/01/2015   MONOPCT 10.0 06/22/2011   EOSPCT 2.000000 03/01/2015   EOSPCT 4.5 06/22/2011   BASOPCT 1.000000 03/01/2015   BASOPCT 0.6 06/22/2011    CMP: Lab Results  Component Value Date   NA 137 03/02/2015   NA 133* 06/02/2014   K 3.6 03/02/2015   K 3.0* 06/02/2014   CL 99* 03/02/2015   CL 93* 06/02/2014   CO2 30 03/02/2015   CO2 29 06/02/2014   BUN 23* 03/02/2015   BUN 19 06/02/2014   CREATININE 1.25* 03/02/2015   CREATININE 1.20* 06/02/2014   PROT 8.8* 03/01/2015   PROT 7.6 06/19/2011   ALBUMIN 4.1 03/01/2015   ALBUMIN 3.3* 06/19/2011   BILITOT 0.5 03/01/2015   BILITOT 0.8 06/19/2011   ALKPHOS 74 03/01/2015   ALKPHOS 76 06/19/2011   AST 35 03/01/2015   AST 29 06/19/2011   ALT 22 03/01/2015   ALT 19 06/19/2011  .   Total Time in preparing paper work, data evaluation and todays exam - 35 minutes  Lejla Moeser M.D on 03/05/2015 at 9:24 AM    Note: This dictation was prepared with Dragon dictation along with smaller phrase technology. Any transcriptional errors that result from this process are unintentional.

## 2015-03-06 LAB — CULTURE, BLOOD (ROUTINE X 2)
CULTURE: NO GROWTH
CULTURE: NO GROWTH

## 2015-03-09 ENCOUNTER — Inpatient Hospital Stay
Admission: EM | Admit: 2015-03-09 | Discharge: 2015-03-13 | DRG: 193 | Disposition: A | Discharge status: Skilled Nursing Facility | Payer: Medicare Other | Attending: Internal Medicine | Admitting: Internal Medicine

## 2015-03-09 ENCOUNTER — Encounter: Payer: Self-pay | Admitting: Emergency Medicine

## 2015-03-09 ENCOUNTER — Emergency Department: Payer: Medicare Other

## 2015-03-09 DIAGNOSIS — Z7982 Long term (current) use of aspirin: Secondary | ICD-10-CM

## 2015-03-09 DIAGNOSIS — Z79899 Other long term (current) drug therapy: Secondary | ICD-10-CM

## 2015-03-09 DIAGNOSIS — J189 Pneumonia, unspecified organism: Secondary | ICD-10-CM | POA: Diagnosis not present

## 2015-03-09 DIAGNOSIS — J9601 Acute respiratory failure with hypoxia: Secondary | ICD-10-CM | POA: Diagnosis present

## 2015-03-09 DIAGNOSIS — N183 Chronic kidney disease, stage 3 (moderate): Secondary | ICD-10-CM | POA: Diagnosis present

## 2015-03-09 DIAGNOSIS — Z882 Allergy status to sulfonamides status: Secondary | ICD-10-CM

## 2015-03-09 DIAGNOSIS — Z888 Allergy status to other drugs, medicaments and biological substances status: Secondary | ICD-10-CM | POA: Diagnosis not present

## 2015-03-09 DIAGNOSIS — Z8701 Personal history of pneumonia (recurrent): Secondary | ICD-10-CM

## 2015-03-09 DIAGNOSIS — E039 Hypothyroidism, unspecified: Secondary | ICD-10-CM | POA: Diagnosis present

## 2015-03-09 DIAGNOSIS — Z880 Allergy status to penicillin: Secondary | ICD-10-CM

## 2015-03-09 DIAGNOSIS — I129 Hypertensive chronic kidney disease with stage 1 through stage 4 chronic kidney disease, or unspecified chronic kidney disease: Secondary | ICD-10-CM | POA: Diagnosis present

## 2015-03-09 DIAGNOSIS — F03918 Unspecified dementia, unspecified severity, with other behavioral disturbance: Secondary | ICD-10-CM

## 2015-03-09 DIAGNOSIS — E785 Hyperlipidemia, unspecified: Secondary | ICD-10-CM

## 2015-03-09 DIAGNOSIS — F0390 Unspecified dementia without behavioral disturbance: Secondary | ICD-10-CM | POA: Diagnosis present

## 2015-03-09 DIAGNOSIS — Y95 Nosocomial condition: Secondary | ICD-10-CM | POA: Diagnosis present

## 2015-03-09 DIAGNOSIS — F0391 Unspecified dementia with behavioral disturbance: Secondary | ICD-10-CM

## 2015-03-09 DIAGNOSIS — I10 Essential (primary) hypertension: Secondary | ICD-10-CM

## 2015-03-09 LAB — URINALYSIS COMPLETE WITH MICROSCOPIC (ARMC ONLY)
BILIRUBIN URINE: NEGATIVE
GLUCOSE, UA: NEGATIVE mg/dL
HGB URINE DIPSTICK: NEGATIVE
Ketones, ur: NEGATIVE mg/dL
LEUKOCYTES UA: NEGATIVE
Nitrite: NEGATIVE
Protein, ur: NEGATIVE mg/dL
SPECIFIC GRAVITY, URINE: 1.017 (ref 1.005–1.030)
pH: 5 (ref 5.0–8.0)

## 2015-03-09 LAB — CBC WITH DIFFERENTIAL/PLATELET
BASOS ABS: 0.2 10*3/uL — AB (ref 0–0.1)
Basophils Relative: 1 %
EOS ABS: 0.5 10*3/uL (ref 0–0.7)
Eosinophils Relative: 3 %
HCT: 36.9 % (ref 35.0–47.0)
Hemoglobin: 12.1 g/dL (ref 12.0–16.0)
LYMPHS ABS: 3.6 10*3/uL (ref 1.0–3.6)
Lymphocytes Relative: 20 %
MCH: 27.4 pg (ref 26.0–34.0)
MCHC: 32.8 g/dL (ref 32.0–36.0)
MCV: 83.4 fL (ref 80.0–100.0)
MONO ABS: 3.1 10*3/uL — AB (ref 0.2–0.9)
Monocytes Relative: 17 %
NEUTROS PCT: 59 %
Neutro Abs: 10.7 10*3/uL — ABNORMAL HIGH (ref 1.4–6.5)
PLATELETS: 181 10*3/uL (ref 150–440)
RBC: 4.43 MIL/uL (ref 3.80–5.20)
RDW: 15.1 % — AB (ref 11.5–14.5)
WBC: 18.1 10*3/uL — AB (ref 3.6–11.0)

## 2015-03-09 LAB — COMPREHENSIVE METABOLIC PANEL
ALBUMIN: 3.8 g/dL (ref 3.5–5.0)
ALK PHOS: 62 U/L (ref 38–126)
ALT: 32 U/L (ref 14–54)
AST: 29 U/L (ref 15–41)
Anion gap: 8 (ref 5–15)
BILIRUBIN TOTAL: 0.3 mg/dL (ref 0.3–1.2)
BUN: 33 mg/dL — AB (ref 6–20)
CALCIUM: 8.7 mg/dL — AB (ref 8.9–10.3)
CHLORIDE: 107 mmol/L (ref 101–111)
CO2: 27 mmol/L (ref 22–32)
CREATININE: 1.45 mg/dL — AB (ref 0.44–1.00)
GFR calc Af Amer: 37 mL/min — ABNORMAL LOW (ref >60)
GFR, EST NON AFRICAN AMERICAN: 32 mL/min — AB (ref >60)
Glucose, Bld: 146 mg/dL — ABNORMAL HIGH (ref 65–99)
Potassium: 3.9 mmol/L (ref 3.5–5.1)
Sodium: 142 mmol/L (ref 135–145)
TOTAL PROTEIN: 8 g/dL (ref 6.5–8.1)

## 2015-03-09 LAB — TROPONIN I

## 2015-03-09 LAB — LACTIC ACID, PLASMA: LACTIC ACID, VENOUS: 1.7 mmol/L (ref 0.5–2.0)

## 2015-03-09 MED ORDER — IOHEXOL 300 MG/ML  SOLN
50.0000 mL | Freq: Once | INTRAMUSCULAR | Status: AC | PRN
  Administered 2015-03-09: 50 mL via INTRAVENOUS

## 2015-03-09 MED ORDER — PIPERACILLIN-TAZOBACTAM 3.375 G IVPB 30 MIN
3.3750 g | Freq: Once | INTRAVENOUS | Status: AC
  Administered 2015-03-09: 3.375 g via INTRAVENOUS
  Filled 2015-03-09: qty 50

## 2015-03-09 MED ORDER — VANCOMYCIN HCL IN DEXTROSE 1-5 GM/200ML-% IV SOLN
1000.0000 mg | Freq: Once | INTRAVENOUS | Status: AC
  Administered 2015-03-10: 1000 mg via INTRAVENOUS
  Filled 2015-03-09: qty 200

## 2015-03-09 NOTE — ED Provider Notes (Signed)
[facility] Emergency Department Provider Note   ____________________________________________  Time seen: Upon ED arrival I have reviewed the triage vital signs and the triage nursing note.  HISTORY  Chief Complaint Respiratory Distress   Historian Limited as patient is poor historian with dementia Nursing home report  HPI Regina Fields is a 82 y.o. female with a history of dementia and what sounds like recently treated with antibiotics for bronchitis, is here because of increased cough with no report of sputum, and "rattling" in the lungs. No report of fever.  No change in mental status reported.  She was recently in the hospital treated for pneumonia with Rocephin and azithromycin, discharged 03/05/15.    Past Medical History  Diagnosis Date  . Dementia     Patient Active Problem List   Diagnosis Date Noted  . Acute respiratory failure (HCC) 03/01/2015    History reviewed. No pertinent past surgical history.  Current Outpatient Rx  Name  Route  Sig  Dispense  Refill  . acetaminophen (TYLENOL) 325 MG tablet   Oral   Take 650 mg by mouth every 4 (four) hours as needed for mild pain, moderate pain or fever.         Marland Kitchen albuterol (PROVENTIL HFA;VENTOLIN HFA) 108 (90 Base) MCG/ACT inhaler   Inhalation   Inhale 2 puffs into the lungs every 4 (four) hours as needed for wheezing.         Marland Kitchen amLODipine (NORVASC) 5 MG tablet   Oral   Take 5 mg by mouth daily.         Marland Kitchen aspirin 81 MG chewable tablet   Oral   Chew 81 mg by mouth daily.         Marland Kitchen atenolol (TENORMIN) 25 MG tablet   Oral   Take 25 mg by mouth daily.         Marland Kitchen azithromycin (ZITHROMAX) 250 MG tablet      Po daily for 5 days   6 each   0   . furosemide (LASIX) 40 MG tablet   Oral   Take 40 mg by mouth daily.         Marland Kitchen levothyroxine (SYNTHROID, LEVOTHROID) 137 MCG tablet   Oral   Take 137 mcg by mouth daily before breakfast.         . montelukast (SINGULAIR) 10  MG tablet   Oral   Take 10 mg by mouth every evening.         . Multiple Vitamin (THEREMS) TABS   Oral   Take 1 tablet by mouth daily.         . NON FORMULARY   Oral   Take 120 mLs by mouth 2 (two) times daily. *Medpass 2.0*         . potassium chloride (K-DUR) 10 MEQ tablet   Oral   Take 10 mEq by mouth daily.         Marland Kitchen SALINE NASAL SPRAY NA   Nasal   Place 2 sprays into the nose daily as needed (for allergies.).         Marland Kitchen traMADol (ULTRAM) 50 MG tablet   Oral   Take 50 mg by mouth 2 (two) times daily.           Allergies Alendronate; Candesartan; Amoxicillin; Atorvastatin; Ceftriaxone; Levofloxacin; Lisinopril; Losartan; Nizatidine; Nsaids; Omeprazole magnesium; Psyllium; Sertaconazole; and Sulfa antibiotics  History reviewed. No pertinent family history.  Social History Social History  Substance Use Topics  .  Smoking status: Unknown If Ever Smoked  . Smokeless tobacco: Never Used  . Alcohol Use: None    Review of Systems  Constitutional: Negative for fever. Eyes: Negative for visual changes. ENT: Negative for sore throat. Cardiovascular: Negative for chest pain. Respiratory: Positive for cough. Gastrointestinal: Negative for abdominal pain, vomiting and diarrhea. Genitourinary: Negative for dysuria. Musculoskeletal: Negative for back pain. Skin: Negative for rash. Neurological: Negative for headache. 10 point Review of Systems otherwise negative ____________________________________________   PHYSICAL EXAM:  VITAL SIGNS: ED Triage Vitals  Enc Vitals Group     BP 03/09/15 1751 145/72 mmHg     Pulse Rate 03/09/15 1751 80     Resp --      Temp 03/09/15 1751 98.5 F (36.9 C)     Temp Source 03/09/15 1751 Oral     SpO2 03/09/15 1751 98 %     Weight 03/09/15 1751 144 lb 4 oz (65.431 kg)     Height 03/09/15 1751  (1.651 m)     Head Cir --      Peak Flow --      Pain Score 03/09/15 1754 0     Pain Loc --      Pain Edu? --      Excl.  in GC? --      Constitutional: Alert but disoriented.  Eyes: Conjunctivae are normal. PERRL. Normal extraocular movements. ENT   Head: Normocephalic and atraumatic.   Nose: No congestion/rhinnorhea.   Mouth/Throat: Mucous membranes are mildly dry.   Neck: No stridor. Cardiovascular/Chest: Normal rate, regular rhythm.  No murmurs, rubs, or gallops. Respiratory: Normal respiratory effort without tachypnea nor retractions. Thick rhonchi throughout all lung fields. No wheezing. Gastrointestinal: Soft. No distention, no guarding, no rebound. Nontender.  Genitourinary/rectal:Deferred Musculoskeletal: Nontender with normal range of motion in all extremities. No joint effusions.  No lower extremity tenderness.  No edema. Neurologic:  No gross or focal neurologic deficits are appreciated. Skin:  Skin is warm, dry and intact. No rash noted.   ____________________________________________   EKG I, Governor Rooks, MD, the attending physician have personally viewed and interpreted all ECGs.  84 bpm. Sinus rhythm. Left axis deviation. LVH. Right bundle branch block. Nonspecific ST-T wave ____________________________________________  LABS (pertinent positives/negatives)  Lactate 1.7 Urinalysis negative Comprehensive metabolic panel significant for BUN 33 and creatinine 1.45 White blood count 18.1, hemoglobin 12.1, platelet count 181 Troponin less than 0.03 ____________________________________________  RADIOLOGY All Xrays were viewed by me. Imaging interpreted by Radiologist.  Chest portable 1 view:IMPRESSION: 1. Stable moderate changes of bronchitis since the examination 6 days ago. 2. Suboptimal inspiration accounts for bibasilar atelectasis. 3. No acute cardiopulmonary disease otherwise.  Chest CT with contrast: IMPRESSION: 1. Bronchial thickening superimposed on background bronchial calcification. This most significant in the left lower lobe were there is near  complete bronchial occlusion in the basal portion, likely secondary to bronchial thickening, less likely mucous plugging. 2. Scattered linear, patchy, and slightly nodular opacities several both lungs, most significant at the left lung base. Most consistent with scattered pneumonitis, likely infectious or inflammatory. No confluent pneumonia. The largest nodular component in the left lower lobe measure 6 mm, and while likely infectious or inflammatory, is not definitively seen on prior CT and therefore follow-up is recommended. If the patient is at high risk for bronchogenic carcinoma, follow-up chest CT at 6-12 months is recommended. If the patient is at low risk for bronchogenic carcinoma, follow-up chest CT at 12 months is recommended. This recommendation follows the  consensus statement: Guidelines for Management of Small Pulmonary Nodules Detected on CT Scans: A Statement from the Fleischner Society as published in Radiology 2005;237:395-400.  __________________________________________  PROCEDURES  Procedure(s) performed: None  Critical Care performed: None  ____________________________________________   ED COURSE / ASSESSMENT AND PLAN  Pertinent labs & imaging results that were available during my care of the patient were reviewed by me and considered in my medical decision making (see chart for details).   Patient has very thick rock ronchorous breath sounds, with increased WBC.  Chest x-ray shows signs of bronchitis, chest CT was obtained and there are signs of scattered pneumonitis read by radiologist as infectious or inflammatory. No confluent pneumonia, but with increased clinical lung symptoms with elevated white blood cell count, patient will be treated for healthcare acquired pulmonary infection and admitted for observation.    CONSULTATIONS:   Hospitalist for admission.   Patient / Family / Caregiver informed of clinical course, medical decision-making process,  and agree with plan.     ___________________________________________   FINAL CLINICAL IMPRESSION(S) / ED DIAGNOSES   Final diagnoses:  HCAP (healthcare-associated pneumonia)              Note: This dictation was prepared with Dragon dictation. Any transcriptional errors that result from this process are unintentional   Governor Rooks, MD 03/09/15 2145

## 2015-03-09 NOTE — H&P (Signed)
PCP:   Unknown   Chief Complaint:  Hypoxia  HPI: This is a 82 year old female with dementia who was sent over from the nursing home. She was recently treated for pneumonia and discharge in 02/2515 and his back with shortness of breath and hypoxia. The patient is unable to provide any meaningful history due to her dementia. In the ER her initial O2 sats was in the 80s. She's been treated for hospital-acquired pneumonia and the hospitalist have been requested to admit  Review of Systems:  Unable to obtain due to patient's dementia.  Past Medical History: Past Medical History  Diagnosis Date  . Dementia    Past Surgical History  Procedure Laterality Date  . Tonsillectomy      Medications: Prior to Admission medications   Medication Sig Start Date End Date Taking? Authorizing Provider  acetaminophen (TYLENOL) 325 MG tablet Take 650 mg by mouth every 4 (four) hours as needed for mild pain, moderate pain or fever.   Yes Historical Provider, MD  albuterol (PROVENTIL HFA;VENTOLIN HFA) 108 (90 Base) MCG/ACT inhaler Inhale 2 puffs into the lungs every 4 (four) hours as needed for wheezing.   Yes Historical Provider, MD  amLODipine (NORVASC) 5 MG tablet Take 5 mg by mouth daily.   Yes Historical Provider, MD  aspirin 81 MG chewable tablet Chew 81 mg by mouth daily.   Yes Historical Provider, MD  atenolol (TENORMIN) 25 MG tablet Take 25 mg by mouth daily.   Yes Historical Provider, MD  azithromycin (ZITHROMAX) 250 MG tablet Po daily for 5 days Patient taking differently: Take 250 mg by mouth daily. Po daily for 5 days 03/05/15  Yes Katha Hamming, MD  furosemide (LASIX) 40 MG tablet Take 40 mg by mouth daily.   Yes Historical Provider, MD  levothyroxine (SYNTHROID, LEVOTHROID) 137 MCG tablet Take 137 mcg by mouth daily before breakfast.   Yes Historical Provider, MD  montelukast (SINGULAIR) 10 MG tablet Take 10 mg by mouth every evening.   Yes Historical Provider, MD  Multiple Vitamin  (THEREMS) TABS Take 1 tablet by mouth daily.   Yes Historical Provider, MD  NON FORMULARY Take 120 mLs by mouth 2 (two) times daily. *Medpass 2.0*   Yes Historical Provider, MD  potassium chloride (K-DUR) 10 MEQ tablet Take 10 mEq by mouth daily.   Yes Historical Provider, MD  SALINE NASAL SPRAY NA Place 2 sprays into the nose daily as needed (for allergies.).   Yes Historical Provider, MD  traMADol (ULTRAM) 50 MG tablet Take 50 mg by mouth 2 (two) times daily.   Yes Historical Provider, MD    Allergies:   Allergies  Allergen Reactions  . Alendronate Shortness Of Breath  . Candesartan Shortness Of Breath  . Amoxicillin Diarrhea  . Atorvastatin     Other reaction(s): Muscle Pain  . Ceftriaxone     Other reaction(s): Muscle Pain  . Levofloxacin     Other reaction(s): Unknown  . Lisinopril     Other reaction(s): Unknown  . Losartan     Other reaction(s): Unknown  . Nizatidine     Other reaction(s): Other (See Comments) Blurred vision  . Nsaids     Other reaction(s): Unknown  . Omeprazole Magnesium Nausea Only  . Psyllium     Other reaction(s): Unknown  . Sertaconazole     Other reaction(s): Unknown  . Sulfa Antibiotics Rash    Social History:  Unable to obtain due to patient's dementia  Family History: Unable to obtain due to  patient's dementia and illness  Physical Exam: Filed Vitals:   03/09/15 1931 03/09/15 2200 03/09/15 2215 03/09/15 2230  BP:  119/56  100/49  Pulse:  66 66 65  Temp: 99.8 F (37.7 C)     TempSrc: Rectal     Resp:  Height:      Weight:      SpO2:  97% 97% 96%    General:  Arousable female, demented  Eyes: PERRLA, pink conjunctiva, no scleral icterus ENT: Moist oral mucosa, neck supple, no thyromegaly Lungs:Course, crackles greater on the left side than right  Cardiovascular: regular rate and rhythm, no regurgitation, no gallops, no murmurs. No carotid bruits, no JVD Abdomen: soft, positive BS, non-tender, non-distended, no  organomegaly, not an acute abdomen GU: not examined Neuro:Unable to evaluate due to patient not following instructions  Musculoskeletal:Unable to evaluate due to patient's not following instructions  Skin: no rash, no subcutaneous crepitation, no decubitus Psych:Demented   Labs on Admission:   Recent Labs  03/09/15 1829  NA 142  K 3.9  CL 107  CO2 27  GLUCOSE 146*  BUN 33*  CREATININE 1.45*  CALCIUM 8.7*    Recent Labs  03/09/15 1829  AST 29  ALT 32  ALKPHOS 62  BILITOT 0.3  PROT 8.0  ALBUMIN 3.8   No results for input(s): LIPASE, AMYLASE in the last 72 hours.  Recent Labs  03/09/15 1829  WBC 18.1*  NEUTROABS 10.7*  HGB 12.1  HCT 36.9  MCV 83.4  PLT 181    Recent Labs  03/09/15 1829  TROPONINI <0.03   Invalid input(s): POCBNP No results for input(s): DDIMER in the last 72 hours. No results for input(s): HGBA1C in the last 72 hours. No results for input(s): CHOL, HDL, LDLCALC, TRIG, CHOLHDL, LDLDIRECT in the last 72 hours. No results for input(s): TSH, T4TOTAL, T3FREE, THYROIDAB in the last 72 hours.  Invalid input(s): FREET3 No results for input(s): VITAMINB12, FOLATE, FERRITIN, TIBC, IRON, RETICCTPCT in the last 72 hours.  Micro Results: Recent Results (from the past 240 hour(s))  Culture, blood (routine x 2)     Status: None   Collection Time: 03/01/15  3:18 PM  Result Value Ref Range Status   Specimen Description BLOOD RIGHT ANTECUBITAL  Final   Special Requests BOTTLES DRAWN AEROBIC AND ANAEROBIC  1CC  Final   Culture NO GROWTH 5 DAYS  Final   Report Status 03/06/2015 FINAL  Final  Culture, blood (routine x 2)     Status: None   Collection Time: 03/01/15  3:18 PM  Result Value Ref Range Status   Specimen Description BLOOD LEFT FOREARM  Final   Special Requests BOTTLES DRAWN AEROBIC AND ANAEROBIC  3CC  Final   Culture NO GROWTH 5 DAYS  Final   Report Status 03/06/2015 FINAL  Final  Rapid Influenza A&B Antigens ([facility] only)     Status: None    Collection Time: 03/01/15  4:07 PM  Result Value Ref Range Status   Influenza A ([facility]) NEGATIVE  Final   Influenza B ([facility]) NEGATIVE  Final  MRSA PCR Screening     Status: None   Collection Time: 03/01/15 11:52 PM  Result Value Ref Range Status   MRSA by PCR NEGATIVE NEGATIVE Final    Comment:        The GeneXpert MRSA Assay (FDA approved for NASAL specimens only), is one component of a comprehensive MRSA colonization surveillance program. It is not intended to diagnose MRSA  infection nor to guide or monitor treatment for MRSA infections.      Radiological Exams on Admission: Ct Chest W Contrast  03/09/2015  CLINICAL DATA:  Shortness of breath with elevated low blood cell count. Recent diagnosis of bronchitis. EXAM: CT CHEST WITH CONTRAST TECHNIQUE: Multidetector CT imaging of the chest was performed during intravenous contrast administration. CONTRAST:  50mL OMNIPAQUE IOHEXOL 300 MG/ML  SOLN COMPARISON:  Radiographs earlier this day, as well as 03/03/2015. Chest CT 09/11/2010 FINDINGS: Mediastinum/Lymph Nodes: Thoracic aorta normal in caliber, mild atherosclerosis. Conventional branching pattern from the aortic arch. Coronary artery calcifications are seen. No mediastinal or hilar adenopathy. No pericardial effusion. Lungs/Pleura: Respiratory motion in the lung apices and lower lobes. Progressive calcification of the tracheobronchial tree with bronchial thickening most significant in the lower lobes. Neck and pre bronchial occlusion in the posterior basal left lower lobe, may be secondary to bronchial thickening and mucous plugging. Scattered linear, patchy and slightly nodular opacities in the left lower lobe, periphery of the left upper lobe, and to a lesser extent periphery of the right lower lobe. Largest nodular component in the left lower lobe measures 6 mm. No confluent airspace disease. No pleural effusion. Upper abdomen: There is a 9 mm hypodensity in the periportal liver, too  small to accurately characterize. No acute abnormality. Musculoskeletal: Severe compression deformity of T12 vertebra, near vertebral plana appearance, likely chronic. Hemangioma within T8 vertebral body. There are no acute or suspicious osseous abnormalities. IMPRESSION: 1. Bronchial thickening superimposed on background bronchial calcification. This most significant in the left lower lobe were there is near complete bronchial occlusion in the basal portion, likely secondary to bronchial thickening, less likely mucous plugging. 2. Scattered linear, patchy, and slightly nodular opacities several both lungs, most significant at the left lung base. Most consistent with scattered pneumonitis, likely infectious or inflammatory. No confluent pneumonia. The largest nodular component in the left lower lobe measure 6 mm, and while likely infectious or inflammatory, is not definitively seen on prior CT and therefore follow-up is recommended. If the patient is at high risk for bronchogenic carcinoma, follow-up chest CT at 6-12 months is recommended. If the patient is at low risk for bronchogenic carcinoma, follow-up chest CT at 12 months is recommended. This recommendation follows the consensus statement: Guidelines for Management of Small Pulmonary Nodules Detected on CT Scans: A Statement from the Fleischner Society as published in Radiology 2005;237:395-400. Electronically Signed   By: Rubye Oaks M.D.   On: 03/09/2015 21:17   Dg Chest Portable 1 View  03/09/2015  CLINICAL DATA:  82 year old with diagnosis of acute bronchitis approximately 1 week ago with progressively worsening symptoms. Patient now has acute mental status changes with confusion. EXAM: PORTABLE CHEST 1 VIEW COMPARISON:  03/03/2015 and earlier. FINDINGS: Suboptimal inspiration accounts for crowded bronchovascular markings diffusely and atelectasis in the bases, and accentuates the cardiac silhouette. Taking this into account, cardiac silhouette  likely upper normal. Prominent bronchovascular markings diffusely and central peribronchial thickening, similar to the examination 6 days ago. No confluent airspace consolidation. IMPRESSION: 1. Stable moderate changes of bronchitis since the examination 6 days ago. 2. Suboptimal inspiration accounts for bibasilar atelectasis. 3.  No acute cardiopulmonary disease otherwise. Electronically Signed   By: Hulan Saas M.D.   On: 03/09/2015 18:53    Assessment/Plan Present on Admission:  . Healthcare-associated pneumonia/Leukocytosis -Admit to med telemetry -Blood cultures 2 done -Started on antibiotics Zosyn, doxycycline, vancomycin pharmacy to dose -Doing nebs, respiratory to evaluate and treat -Currently the patient  not very talkative, could benefit from swallow evaluation to rule out aspiration. We'll keep patient NPO left lower lobe measure 6 mm -Follow  Dementia -Stable, monitor Hypothyroidism  -Stable, home medications resumed  HTN -Stable, home medications resumed Dyslipidemia -Stable, home medications held   Javonni Macke 03/09/2015, 11:10 PM

## 2015-03-09 NOTE — ED Notes (Signed)
PT PRESENTS TO ER FROM [facility] WITH O2 90% RA. EMS STATES THAT SHE WAS SEEN 1 WEEK AGO FOR BRONCHITIS BUT SYMPTOMS HAVE WORSENED. ON 3L [place] ON ARRIVAL.

## 2015-03-09 NOTE — ED Notes (Signed)
In CT

## 2015-03-10 ENCOUNTER — Encounter: Payer: Self-pay | Admitting: Emergency Medicine

## 2015-03-10 LAB — BASIC METABOLIC PANEL
ANION GAP: 6 (ref 5–15)
BUN: 31 mg/dL — AB (ref 6–20)
CO2: 29 mmol/L (ref 22–32)
Calcium: 8.7 mg/dL — ABNORMAL LOW (ref 8.9–10.3)
Chloride: 105 mmol/L (ref 101–111)
Creatinine, Ser: 1.31 mg/dL — ABNORMAL HIGH (ref 0.44–1.00)
GFR, EST AFRICAN AMERICAN: 42 mL/min — AB (ref >60)
GFR, EST NON AFRICAN AMERICAN: 36 mL/min — AB (ref >60)
Glucose, Bld: 124 mg/dL — ABNORMAL HIGH (ref 65–99)
POTASSIUM: 3.5 mmol/L (ref 3.5–5.1)
SODIUM: 140 mmol/L (ref 135–145)

## 2015-03-10 LAB — BLOOD GAS, ARTERIAL
Acid-Base Excess: 5.4 mmol/L — ABNORMAL HIGH (ref 0.0–3.0)
BICARBONATE: 30.5 meq/L — AB (ref 21.0–28.0)
FIO2: 0.32
O2 Saturation: 98.4 %
PATIENT TEMPERATURE: 37
PH ART: 7.43 (ref 7.350–7.450)
pCO2 arterial: 46 mmHg (ref 32.0–48.0)
pO2, Arterial: 109 mmHg — ABNORMAL HIGH (ref 83.0–108.0)

## 2015-03-10 LAB — CBC
HEMATOCRIT: 34.5 % — AB (ref 35.0–47.0)
Hemoglobin: 11.6 g/dL — ABNORMAL LOW (ref 12.0–16.0)
MCH: 27.9 pg (ref 26.0–34.0)
MCHC: 33.8 g/dL (ref 32.0–36.0)
MCV: 82.5 fL (ref 80.0–100.0)
Platelets: 173 10*3/uL (ref 150–440)
RBC: 4.18 MIL/uL (ref 3.80–5.20)
RDW: 14.7 % — AB (ref 11.5–14.5)
WBC: 17.1 10*3/uL — AB (ref 3.6–11.0)

## 2015-03-10 LAB — MRSA PCR SCREENING: MRSA by PCR: NEGATIVE

## 2015-03-10 MED ORDER — MONTELUKAST SODIUM 10 MG PO TABS
10.0000 mg | ORAL_TABLET | Freq: Every day | ORAL | Status: DC
  Administered 2015-03-10 – 2015-03-11 (×2): 10 mg via ORAL
  Filled 2015-03-10 (×3): qty 1

## 2015-03-10 MED ORDER — VANCOMYCIN HCL IN DEXTROSE 1-5 GM/200ML-% IV SOLN
1000.0000 mg | INTRAVENOUS | Status: DC
  Administered 2015-03-10: 1000 mg via INTRAVENOUS
  Filled 2015-03-10: qty 200

## 2015-03-10 MED ORDER — LEVOTHYROXINE SODIUM 50 MCG PO TABS
137.0000 ug | ORAL_TABLET | Freq: Every day | ORAL | Status: DC
  Administered 2015-03-10 – 2015-03-13 (×4): 137 ug via ORAL
  Filled 2015-03-10 (×6): qty 1

## 2015-03-10 MED ORDER — PIPERACILLIN-TAZOBACTAM 4.5 G IVPB
4.5000 g | Freq: Three times a day (TID) | INTRAVENOUS | Status: DC
  Administered 2015-03-10 – 2015-03-12 (×7): 4.5 g via INTRAVENOUS
  Filled 2015-03-10 (×9): qty 100

## 2015-03-10 MED ORDER — ACETAMINOPHEN 650 MG RE SUPP: 650.0000 mg | Freq: Four times a day (QID) | RECTAL | Status: DC | PRN

## 2015-03-10 MED ORDER — ACETAMINOPHEN 325 MG PO TABS: 650.0000 mg | ORAL_TABLET | Freq: Four times a day (QID) | ORAL | Status: DC | PRN

## 2015-03-10 MED ORDER — ATENOLOL 25 MG PO TABS
25.0000 mg | ORAL_TABLET | Freq: Every day | ORAL | Status: DC
  Administered 2015-03-10 – 2015-03-13 (×3): 25 mg via ORAL
  Filled 2015-03-10 (×4): qty 1

## 2015-03-10 MED ORDER — FUROSEMIDE 40 MG PO TABS
40.0000 mg | ORAL_TABLET | Freq: Every day | ORAL | Status: DC
  Administered 2015-03-10 – 2015-03-13 (×3): 40 mg via ORAL
  Filled 2015-03-10 (×4): qty 1

## 2015-03-10 MED ORDER — ENOXAPARIN SODIUM 30 MG/0.3ML ~~LOC~~ SOLN
30.0000 mg | SUBCUTANEOUS | Status: DC
  Administered 2015-03-10 – 2015-03-12 (×3): 30 mg via SUBCUTANEOUS
  Filled 2015-03-10 (×3): qty 0.3

## 2015-03-10 MED ORDER — IPRATROPIUM-ALBUTEROL 0.5-2.5 (3) MG/3ML IN SOLN
3.0000 mL | RESPIRATORY_TRACT | Status: DC | PRN
Start: 2015-03-10 — End: 2015-03-12

## 2015-03-10 MED ORDER — ASPIRIN 81 MG PO CHEW
81.0000 mg | CHEWABLE_TABLET | Freq: Every day | ORAL | Status: DC
  Administered 2015-03-10 – 2015-03-13 (×4): 81 mg via ORAL
  Filled 2015-03-10 (×4): qty 1

## 2015-03-10 MED ORDER — AMLODIPINE BESYLATE 5 MG PO TABS
5.0000 mg | ORAL_TABLET | Freq: Every day | ORAL | Status: DC
  Administered 2015-03-10 – 2015-03-13 (×2): 5 mg via ORAL
  Filled 2015-03-10 (×4): qty 1

## 2015-03-10 MED ORDER — SODIUM CHLORIDE 0.9% FLUSH: 3.0000 mL | INTRAVENOUS | Status: DC | PRN

## 2015-03-10 MED ORDER — ENOXAPARIN SODIUM 40 MG/0.4ML ~~LOC~~ SOLN: 40.0000 mg | SUBCUTANEOUS | Status: DC

## 2015-03-10 MED ORDER — DOXYCYCLINE HYCLATE 100 MG IV SOLR
100.0000 mg | Freq: Two times a day (BID) | INTRAVENOUS | Status: DC
  Administered 2015-03-10 – 2015-03-13 (×8): 100 mg via INTRAVENOUS
  Filled 2015-03-10 (×9): qty 100

## 2015-03-10 MED ORDER — SODIUM CHLORIDE 0.9% FLUSH
3.0000 mL | Freq: Two times a day (BID) | INTRAVENOUS | Status: DC
  Administered 2015-03-10: 3 mL via INTRAVENOUS

## 2015-03-10 MED ORDER — PROMETHAZINE HCL 25 MG PO TABS: 12.5000 mg | ORAL_TABLET | Freq: Four times a day (QID) | ORAL | Status: DC | PRN

## 2015-03-10 NOTE — NC FL2 (Deleted)
[place] MEDICAID FL2 LEVEL OF CARE SCREENING TOOL     IDENTIFICATION  Patient Name: Regina Fields Birthdate: Jun 20, 1928 Sex: female Admission Date (Current Location): 03/09/2015  [place] and [place] Number:  Chiropodist and Address:  [facility], [address] [place], [place] [address]      Provider Number: [id number]  Attending Physician Name and Address:  Milagros Loll, MD  Relative Name and Phone Number:       Current Level of Care: Hospital Recommended Level of Care: Skilled Nursing Facility Prior Approval Number:    Date Approved/Denied:   PASRR Number:  [id number])  Discharge Plan: SNF    Current Diagnoses: Patient Active Problem List   Diagnosis Date Noted  . Healthcare-associated pneumonia 03/09/2015  . HTN (hypertension) 03/09/2015  . Dementia 03/09/2015  . Dyslipidemia 03/09/2015  . HCAP (healthcare-associated pneumonia) 03/09/2015  . Acute respiratory failure (HCC) 03/01/2015    Orientation RESPIRATION BLADDER Height & Weight     Self  O2 (Nasal Cannula 3 (L/min) ) Incontinent Weight: 136 lb (61.689 kg) Height:   (165.1 cm)  BEHAVIORAL SYMPTOMS/MOOD NEUROLOGICAL BOWEL NUTRITION STATUS   (None )  (None ) Incontinent Diet (NPO time specified )  AMBULATORY STATUS COMMUNICATION OF NEEDS Skin   Extensive Assist Verbally Normal                       Personal Care Assistance Level of Assistance  Bathing, Feeding, Dressing Bathing Assistance: Limited assistance Feeding assistance: Independent Dressing Assistance: Limited assistance     Functional Limitations Info  Sight, Hearing, Speech Sight Info: Impaired Hearing Info: Adequate Speech Info: Adequate    SPECIAL CARE FACTORS FREQUENCY  PT (By licensed PT)     PT Frequency:  (2-3 times per week)              Contractures      Additional Factors Info  Code Status, Allergies Code Status Info:  (Full Code ) Allergies Info:   (Alendronate, Candesartan, Amoxicillin, Atorvastatin, Ceftriaxone, Levofloxacin, Lisinopril, Losartan, Nizatidine, Nsaids, Omeprazole Magnesium, Psyllium, Sertaconazole, Sulfa Antibiotics)           Current Medications (03/10/2015):  This is the current hospital active medication list Current Facility-Administered Medications  Medication Dose Route Frequency Provider Last Rate Last Dose  . acetaminophen (TYLENOL) tablet 650 mg  650 mg Oral Q6H PRN Debby Crosley, MD       Or  . acetaminophen (TYLENOL) suppository 650 mg  650 mg Rectal Q6H PRN Debby Crosley, MD      . amLODipine (NORVASC) tablet 5 mg  5 mg Oral Daily Debby Crosley, MD   5 mg at 03/10/15 0932  . aspirin chewable tablet 81 mg  81 mg Oral Daily Debby Crosley, MD   81 mg at 03/10/15 0932  . atenolol (TENORMIN) tablet 25 mg  25 mg Oral Daily Debby Crosley, MD   25 mg at 03/10/15 0932  . doxycycline (VIBRAMYCIN) 100 mg in dextrose 5 % 250 mL IVPB  100 mg Intravenous Q12H Debby Crosley, MD   100 mg at 03/10/15 1217  . enoxaparin (LOVENOX) injection 30 mg  30 mg Subcutaneous Q24H Debby Crosley, MD      . furosemide (LASIX) tablet 40 mg  40 mg Oral Daily Debby Crosley, MD   40 mg at 03/10/15 0932  . ipratropium-albuterol (DUONEB) 0.5-2.5 (3) MG/3ML nebulizer solution 3 mL  3 mL Nebulization Q4H PRN Gery Pray, MD      .  levothyroxine (SYNTHROID, LEVOTHROID) tablet 137 mcg  137 mcg Oral QAC breakfast Gery Pray, MD   137 mcg at 03/10/15 0844  . montelukast (SINGULAIR) tablet 10 mg  10 mg Oral QHS Srikar Sudini, MD      . piperacillin-tazobactam (ZOSYN) IVPB 4.5 g  4.5 g Intravenous 3 times per day Gery Pray, MD   4.5 g at 03/10/15 1323  . promethazine (PHENERGAN) tablet 12.5 mg  12.5 mg Oral Q6H PRN Debby Crosley, MD      . sodium chloride flush (NS) 0.9 % injection 3 mL  3 mL Intravenous Q12H Debby Crosley, MD   3 mL at 03/10/15 0933  . vancomycin (VANCOCIN) IVPB 1000 mg/200 mL premix  1,000 mg Intravenous Q36H Debby Crosley,  MD   1,000 mg at 03/10/15 1458     Discharge Medications: Please see discharge summary for a list of discharge medications.  Relevant Imaging Results:  Relevant Lab Results:   Additional Information  (SSN [id number])  Verta Ellen Jennalyn Cawley, LCSW

## 2015-03-10 NOTE — [place] FL2 (Signed)
[place] MEDICAID FL2 LEVEL OF CARE SCREENING TOOL     IDENTIFICATION  Patient Name: Regina Fields Birthdate: [birth year] Sex: female Admission Date (Current Location): 03/09/2015  [place] and [place] Number:  Chiropodist and Address:  [facility], [address] [place], [place] [address]      Provider Number: [id number]  Attending Physician Name and Address:  Milagros Loll, MD  Relative Name and Phone Number:       Current Level of Care: Hospital Recommended Level of Care: Skilled Nursing Facility Prior Approval Number:    Date Approved/Denied:   PASRR Number:  [id number])  Discharge Plan: SNF    Current Diagnoses: Patient Active Problem List   Diagnosis Date Noted  . Healthcare-associated pneumonia 03/09/2015  . HTN (hypertension) 03/09/2015  . Dementia 03/09/2015  . Dyslipidemia 03/09/2015  . HCAP (healthcare-associated pneumonia) 03/09/2015  . Acute respiratory failure (HCC) 03/01/2015    Orientation RESPIRATION BLADDER Height & Weight     Self  O2 (Nasal Cannula 3 (L/min) ) Incontinent Weight: 136 lb (61.689 kg) Height:   (165.1 cm)  BEHAVIORAL SYMPTOMS/MOOD NEUROLOGICAL BOWEL NUTRITION STATUS   (None )  (None ) Incontinent Diet (NPO time specified )  AMBULATORY STATUS COMMUNICATION OF NEEDS Skin   Extensive Assist Verbally Normal                       Personal Care Assistance Level of Assistance  Bathing, Feeding, Dressing Bathing Assistance: Limited assistance Feeding assistance: Independent Dressing Assistance: Limited assistance     Functional Limitations Info  Sight, Hearing, Speech Sight Info: Impaired Hearing Info: Adequate Speech Info: Adequate    SPECIAL CARE FACTORS FREQUENCY                   Contractures      Additional Factors Info  Code Status, Allergies Code Status Info:  (Full Code ) Allergies Info:  (Alendronate, Candesartan, Amoxicillin, Atorvastatin, Ceftriaxone,  Levofloxacin, Lisinopril, Losartan, Nizatidine, Nsaids, Omeprazole Magnesium, Psyllium, Sertaconazole, Sulfa Antibiotics)           Current Medications (03/10/2015):  This is the current hospital active medication list Current Facility-Administered Medications  Medication Dose Route Frequency Provider Last Rate Last Dose  . acetaminophen (TYLENOL) tablet 650 mg  650 mg Oral Q6H PRN Debby Crosley, MD       Or  . acetaminophen (TYLENOL) suppository 650 mg  650 mg Rectal Q6H PRN Debby Crosley, MD      . amLODipine (NORVASC) tablet 5 mg  5 mg Oral Daily Debby Crosley, MD   5 mg at 03/10/15 0932  . aspirin chewable tablet 81 mg  81 mg Oral Daily Debby Crosley, MD   81 mg at 03/10/15 0932  . atenolol (TENORMIN) tablet 25 mg  25 mg Oral Daily Debby Crosley, MD   25 mg at 03/10/15 0932  . doxycycline (VIBRAMYCIN) 100 mg in dextrose 5 % 250 mL IVPB  100 mg Intravenous Q12H Debby Crosley, MD   100 mg at 03/10/15 1217  . enoxaparin (LOVENOX) injection 30 mg  30 mg Subcutaneous Q24H Debby Crosley, MD      . furosemide (LASIX) tablet 40 mg  40 mg Oral Daily Debby Crosley, MD   40 mg at 03/10/15 0932  . ipratropium-albuterol (DUONEB) 0.5-2.5 (3) MG/3ML nebulizer solution 3 mL  3 mL Nebulization Q4H PRN Debby Crosley, MD      . levothyroxine (SYNTHROID, LEVOTHROID) tablet 137 mcg  137 mcg  Oral QAC breakfast Gery Pray, MD   137 mcg at 03/10/15 0844  . montelukast (SINGULAIR) tablet 10 mg  10 mg Oral QHS Srikar Sudini, MD      . piperacillin-tazobactam (ZOSYN) IVPB 4.5 g  4.5 g Intravenous 3 times per day Gery Pray, MD   4.5 g at 03/10/15 1323  . promethazine (PHENERGAN) tablet 12.5 mg  12.5 mg Oral Q6H PRN Debby Crosley, MD      . sodium chloride flush (NS) 0.9 % injection 3 mL  3 mL Intravenous Q12H Debby Crosley, MD   3 mL at 03/10/15 0933  . vancomycin (VANCOCIN) IVPB 1000 mg/200 mL premix  1,000 mg Intravenous Q36H Debby Crosley, MD   1,000 mg at 03/10/15 1458     Discharge  Medications: Please see discharge summary for a list of discharge medications.  Relevant Imaging Results:  Relevant Lab Results:   Additional Information  (SSN [id number])  Verta Ellen Sunkins, LCSW

## 2015-03-10 NOTE — Progress Notes (Signed)
[facility] - [place] at [facility]   PATIENT NAME: Regina Fields    MR#:  [id number]  DATE OF BIRTH:  Nov 12, 1928  SUBJECTIVE:   Admitted for SOB with recurrent pneumonia. From SNF. Has dementia. Discharged 1/25 on RA.  Today she is still on 3 L O2.  Waiting for swallow eval.  REVIEW OF SYSTEMS:   Review of Systems  Unable to perform ROS: dementia     DRUG ALLERGIES:   Allergies  Allergen Reactions  . Alendronate Shortness Of Breath  . Candesartan Shortness Of Breath  . Amoxicillin Diarrhea  . Atorvastatin     Other reaction(s): Muscle Pain  . Ceftriaxone     Other reaction(s): Muscle Pain  . Levofloxacin     Other reaction(s): Unknown  . Lisinopril     Other reaction(s): Unknown  . Losartan     Other reaction(s): Unknown  . Nizatidine     Other reaction(s): Other (See Comments) Blurred vision  . Nsaids     Other reaction(s): Unknown  . Omeprazole Magnesium Nausea Only  . Psyllium     Other reaction(s): Unknown  . Sertaconazole     Other reaction(s): Unknown  . Sulfa Antibiotics Rash    VITALS:  Blood pressure 119/57, pulse 64, temperature 98.1 F (36.7 C), temperature source Oral, resp. rate 18, height  (1.651 m), weight 61.689 kg (136 lb), SpO2 96 %.  PHYSICAL EXAMINATION:   Physical Exam  GENERAL:  82 y.o.-year-old patient lying in the bed with no acute distress.  EYES: Pupils equal, round, reactive to light and accommodation. No scleral icterus. Extraocular muscles intact.  HEENT: Head atraumatic, normocephalic. Oropharynx and nasopharynx clear.  NECK:  Supple, no jugular venous distention. No thyroid enlargement, no tenderness.  LUNGS: Coarse breath sounds bilaterally, no wheezing, rales, rhonchi. No use of accessory muscles of respiration.  CARDIOVASCULAR: S1, S2 normal. No murmurs, rubs, or gallops.  ABDOMEN: Soft, nontender, nondistended. Bowel sounds present. No organomegaly or mass.  EXTREMITIES: No cyanosis,  clubbing or edema b/l.    NEUROLOGIC: Cranial nerves II through XII are intact. No focal Motor or sensory deficits b/l.   PSYCHIATRIC:  patient is alert and awake. SKIN: No obvious rash, lesion, or ulcer.   LABORATORY PANEL:  CBC  Recent Labs Lab 03/10/15 0420  WBC 17.1*  HGB 11.6*  HCT 34.5*  PLT 173    Chemistries   Recent Labs Lab 03/09/15 1829 03/10/15 0420  NA 142 140  K 3.9 3.5  CL 107 105  CO2 27 29  GLUCOSE 146* 124*  BUN 33* 31*  CREATININE 1.45* 1.31*  CALCIUM 8.7* 8.7*  AST 29  --   ALT 32  --   ALKPHOS 62  --   BILITOT 0.3  --    Cardiac Enzymes  Recent Labs Lab 03/09/15 1829  TROPONINI <0.03   RADIOLOGY:  Ct Chest W Contrast  03/09/2015  CLINICAL DATA:  Shortness of breath with elevated low blood cell count. Recent diagnosis of bronchitis. EXAM: CT CHEST WITH CONTRAST TECHNIQUE: Multidetector CT imaging of the chest was performed during intravenous contrast administration. CONTRAST:  50mL OMNIPAQUE IOHEXOL 300 MG/ML  SOLN COMPARISON:  Radiographs earlier this day, as well as 03/03/2015. Chest CT 09/11/2010 FINDINGS: Mediastinum/Lymph Nodes: Thoracic aorta normal in caliber, mild atherosclerosis. Conventional branching pattern from the aortic arch. Coronary artery calcifications are seen. No mediastinal or hilar adenopathy. No pericardial effusion. Lungs/Pleura: Respiratory motion in the lung apices and lower lobes. Progressive calcification of  the tracheobronchial tree with bronchial thickening most significant in the lower lobes. Neck and pre bronchial occlusion in the posterior basal left lower lobe, may be secondary to bronchial thickening and mucous plugging. Scattered linear, patchy and slightly nodular opacities in the left lower lobe, periphery of the left upper lobe, and to a lesser extent periphery of the right lower lobe. Largest nodular component in the left lower lobe measures 6 mm. No confluent airspace disease. No pleural effusion. Upper  abdomen: There is a 9 mm hypodensity in the periportal liver, too small to accurately characterize. No acute abnormality. Musculoskeletal: Severe compression deformity of T12 vertebra, near vertebral plana appearance, likely chronic. Hemangioma within T8 vertebral body. There are no acute or suspicious osseous abnormalities. IMPRESSION: 1. Bronchial thickening superimposed on background bronchial calcification. This most significant in the left lower lobe were there is near complete bronchial occlusion in the basal portion, likely secondary to bronchial thickening, less likely mucous plugging. 2. Scattered linear, patchy, and slightly nodular opacities several both lungs, most significant at the left lung base. Most consistent with scattered pneumonitis, likely infectious or inflammatory. No confluent pneumonia. The largest nodular component in the left lower lobe measure 6 mm, and while likely infectious or inflammatory, is not definitively seen on prior CT and therefore follow-up is recommended. If the patient is at high risk for bronchogenic carcinoma, follow-up chest CT at 6-12 months is recommended. If the patient is at low risk for bronchogenic carcinoma, follow-up chest CT at 12 months is recommended. This recommendation follows the consensus statement: Guidelines for Management of Small Pulmonary Nodules Detected on CT Scans: A Statement from the Fleischner Society as published in Radiology 2005;237:395-400. Electronically Signed   By: Rubye Oaks M.D.   On: 03/09/2015 21:17   Dg Chest Portable 1 View  03/09/2015  CLINICAL DATA:  82 year old with diagnosis of acute bronchitis approximately 1 week ago with progressively worsening symptoms. Patient now has acute mental status changes with confusion. EXAM: PORTABLE CHEST 1 VIEW COMPARISON:  03/03/2015 and earlier. FINDINGS: Suboptimal inspiration accounts for crowded bronchovascular markings diffusely and atelectasis in the bases, and accentuates the  cardiac silhouette. Taking this into account, cardiac silhouette likely upper normal. Prominent bronchovascular markings diffusely and central peribronchial thickening, similar to the examination 6 days ago. No confluent airspace consolidation. IMPRESSION: 1. Stable moderate changes of bronchitis since the examination 6 days ago. 2. Suboptimal inspiration accounts for bibasilar atelectasis. 3.  No acute cardiopulmonary disease otherwise. Electronically Signed   By: Hulan Saas M.D.   On: 03/09/2015 18:53   ASSESSMENT AND PLAN:  Lynell Greenhouse is a 82 y.o. female with a known history of dementia brought in from [facility] [place] because of shortness of breath, increased respiratory rate up to 30s, hypoxia with O2 sats 90% on room air  # Acute hypoxic respiratory failure secondary to LLL HCAP - continue oxygen - On IV abx.  Continue nebulizers and inhalers. - Await cx results  # CKD 3 - stable  # Dementia  Case discussed with Care Management/Social Worker.  CODE STATUS: Full  DVT Prophylaxis: Lovenox  TOTAL TIME TAKING CARE OF THIS PATIENT: 35 minutes.   POSSIBLE D/C IN 2-3 DAYS, DEPENDING ON CLINICAL CONDITION.   Milagros Loll R M.D on 03/10/2015 at 11:51 AM  Between 7am to 6pm - Pager - [phone]  After 6pm go to [email or web address] - password [email or web address] [facility]  [place] [facility]  Office  [phone]  CC: Primary care physician; No primary care provider on file.  Note: This dictation was prepared with Dragon dictation along with smaller phrase technology. Any transcriptional errors that result from this process are unintentional.

## 2015-03-10 NOTE — Clinical Social Work Note (Signed)
Clinical Social Work Assessment  Patient Details  Name: Regina Fields MRN: [id number] Date of Birth: 09-23-1928  Date of referral:  03/10/15               Reason for consult:  Discharge Planning                Permission sought to share information with:  Facility Sport and exercise psychologist, Family Supports Permission granted to share information::  Yes, Verbal Permission Granted  Name::        Agency::   [facility] )  Relationship::   ([place] )  Contact Information:     Housing/Transportation Living arrangements for the past 2 months:  [place] of Information:  Patient Patient Interpreter Needed:  None Criminal Activity/Legal Involvement Pertinent to Current Situation/Hospitalization:  No - Comment as needed Significant Relationships:  Other Family Members Lives with:  Facility Resident ([place] ) Do you feel safe going back to the place where you live?  Yes Need for family participation in patient care:  No (Coment)  Care giving concerns:  Patient is a resident at [facility].    Social Worker assessment / plan: CSW was informed that patient is a resident at [facility] Spoke to [employer], admissions coordinator at [facility] She reports that patient is a LTC resident. She stated that patient can return at discharge. CSW met with patient at bedside. She's alert and oriented to self. Per patient she is a resident at [facility]. She stated she's "unsure" about how long she's lived there. Patient reports that she'll return to [facility] at discharge. Verbal Permission granted to call Darnelle Bos (Number disconnected) and Carlyle Lipa (Left voicemail). CSW is awaiting Santina Trillo to return phone call.   FL2/ PASRR completed and faxed to [facility]. CSW will continue to follow and assist.   Employment status:  Retired Forensic scientist:  Medicare, Other (Comment Required) ([employer], contracting. ) PT Recommendations:  Not assessed at  this time Information / Referral to community resources:  [place]  Patient/Family's Response to care:  Patient reports that she wants to return to [facility] at discharge.   Patient/Family's Understanding of and Emotional Response to Diagnosis, Current Treatment, and Prognosis: Patient seemed confused and could not answer some of CSW's questions. Granted verbal permission for CSW to contact Darnelle Bos (Phone disconnected) and Carlyle Lipa. CSW is awaiting phone call back from Anayely Constantine to discuss discharge plans.   Emotional Assessment Appearance:  Appears stated age Attitude/Demeanor/Rapport:   (None ) Affect (typically observed):  Calm, Pleasant Orientation:  Oriented to Self Alcohol / Substance use:  Not Applicable Psych involvement (Current and /or in the community):  No (Comment)  Discharge Needs  Concerns to be addressed:  Discharge Planning Concerns Readmission within the last 30 days:  Yes Current discharge risk:  Cognitively Impaired Barriers to Discharge:  Continued Medical Work up   [employer], LCSW 03/10/2015, 4:34 PM   Ernest Pine, MSW, [place] Work Department [phone]

## 2015-03-10 NOTE — Progress Notes (Signed)
ANTIBIOTIC CONSULT NOTE - INITIAL  Pharmacy Consult for vancomycin and Zosyn dosing Indication: HCAP  Allergies  Allergen Reactions  . Alendronate Shortness Of Breath  . Candesartan Shortness Of Breath  . Amoxicillin Diarrhea  . Atorvastatin     Other reaction(s): Muscle Pain  . Ceftriaxone     Other reaction(s): Muscle Pain  . Levofloxacin     Other reaction(s): Unknown  . Lisinopril     Other reaction(s): Unknown  . Losartan     Other reaction(s): Unknown  . Nizatidine     Other reaction(s): Other (See Comments) Blurred vision  . Nsaids     Other reaction(s): Unknown  . Omeprazole Magnesium Nausea Only  . Psyllium     Other reaction(s): Unknown  . Sertaconazole     Other reaction(s): Unknown  . Sulfa Antibiotics Rash    Patient Measurements: Height:  (165.1 cm) Weight: 144 lb 4 oz (65.431 kg) IBW/kg (Calculated) : 57 Adjusted Body Weight: 65.4kg  Vital Signs: Temp: 98.2 F (36.8 C) (01/30 0039) Temp Source: Oral (01/30 0039) BP: 118/54 mmHg (01/30 0039) Pulse Rate: 80 (01/30 0039) Intake/Output from previous day:   Intake/Output from this shift:    Labs:  Recent Labs  03/09/15 1829  WBC 18.1*  HGB 12.1  PLT 181  CREATININE 1.45*   Estimated Creatinine Clearance: 26 mL/min (by C-G formula based on Cr of 1.45). No results for input(s): VANCOTROUGH, VANCOPEAK, VANCORANDOM, GENTTROUGH, GENTPEAK, GENTRANDOM, TOBRATROUGH, TOBRAPEAK, TOBRARND, AMIKACINPEAK, AMIKACINTROU, AMIKACIN in the last 72 hours.   Microbiology: Recent Results (from the past 720 hour(s))  Culture, blood (routine x 2)     Status: None   Collection Time: 03/01/15  3:18 PM  Result Value Ref Range Status   Specimen Description BLOOD RIGHT ANTECUBITAL  Final   Special Requests BOTTLES DRAWN AEROBIC AND ANAEROBIC  1CC  Final   Culture NO GROWTH 5 DAYS  Final   Report Status 03/06/2015 FINAL  Final  Culture, blood (routine x 2)     Status: None   Collection Time: 03/01/15  3:18  PM  Result Value Ref Range Status   Specimen Description BLOOD LEFT FOREARM  Final   Special Requests BOTTLES DRAWN AEROBIC AND ANAEROBIC  3CC  Final   Culture NO GROWTH 5 DAYS  Final   Report Status 03/06/2015 FINAL  Final  Rapid Influenza A&B Antigens ([facility] only)     Status: None   Collection Time: 03/01/15  4:07 PM  Result Value Ref Range Status   Influenza A ([facility]) NEGATIVE  Final   Influenza B ([facility]) NEGATIVE  Final  MRSA PCR Screening     Status: None   Collection Time: 03/01/15 11:52 PM  Result Value Ref Range Status   MRSA by PCR NEGATIVE NEGATIVE Final    Comment:        The GeneXpert MRSA Assay (FDA approved for NASAL specimens only), is one component of a comprehensive MRSA colonization surveillance program. It is not intended to diagnose MRSA infection nor to guide or monitor treatment for MRSA infections.     Medical History: Past Medical History  Diagnosis Date  . Dementia     Medications:   Assessment: Blood and urine cx pending CXR: no confluent consolidation UA: (-)  Goal of Therapy:  Vancomycin trough level 15-20 mcg/ml  Plan:  TBW 65.4kg  IBW 57kg  DW 65.4kg  Vd 46L kei 0.036 hr-1  t1/2 26 hours Vancomycin 1 gram q 36 hours ordered with stacked dosing.  Level before 4th dose.  Zosyn 4.5 grams q 8 hours ordered for Pseudomonas risk of recent treatment for pneumonia.   Fulton Reek, PharmD, BCPS  03/10/2015

## 2015-03-11 LAB — BASIC METABOLIC PANEL
Anion gap: 9 (ref 5–15)
BUN: 23 mg/dL — AB (ref 6–20)
CHLORIDE: 100 mmol/L — AB (ref 101–111)
CO2: 28 mmol/L (ref 22–32)
CREATININE: 1.39 mg/dL — AB (ref 0.44–1.00)
Calcium: 8.6 mg/dL — ABNORMAL LOW (ref 8.9–10.3)
GFR calc non Af Amer: 34 mL/min — ABNORMAL LOW (ref >60)
GFR, EST AFRICAN AMERICAN: 39 mL/min — AB (ref >60)
Glucose, Bld: 108 mg/dL — ABNORMAL HIGH (ref 65–99)
POTASSIUM: 3.3 mmol/L — AB (ref 3.5–5.1)
Sodium: 137 mmol/L (ref 135–145)

## 2015-03-11 LAB — URINE CULTURE: CULTURE: NO GROWTH

## 2015-03-11 LAB — CBC WITH DIFFERENTIAL/PLATELET
BASOS PCT: 1 %
Basophils Absolute: 0.1 10*3/uL (ref 0–0.1)
EOS PCT: 4 %
Eosinophils Absolute: 0.5 10*3/uL (ref 0–0.7)
HEMATOCRIT: 34.2 % — AB (ref 35.0–47.0)
Hemoglobin: 11.2 g/dL — ABNORMAL LOW (ref 12.0–16.0)
Lymphocytes Relative: 15 %
Lymphs Abs: 2 10*3/uL (ref 1.0–3.6)
MCH: 27 pg (ref 26.0–34.0)
MCHC: 32.8 g/dL (ref 32.0–36.0)
MCV: 82.1 fL (ref 80.0–100.0)
MONO ABS: 2.1 10*3/uL — AB (ref 0.2–0.9)
MONOS PCT: 16 %
NEUTROS PCT: 64 %
Neutro Abs: 8.3 10*3/uL — ABNORMAL HIGH (ref 1.4–6.5)
PLATELETS: 172 10*3/uL (ref 150–440)
RBC: 4.17 MIL/uL (ref 3.80–5.20)
RDW: 15 % — ABNORMAL HIGH (ref 11.5–14.5)
WBC: 13 10*3/uL — ABNORMAL HIGH (ref 3.6–11.0)

## 2015-03-11 LAB — GLUCOSE, CAPILLARY
GLUCOSE-CAPILLARY: 111 mg/dL — AB (ref 65–99)
Glucose-Capillary: 131 mg/dL — ABNORMAL HIGH (ref 65–99)

## 2015-03-11 MED ORDER — BISACODYL 10 MG RE SUPP: 10.0000 mg | Freq: Once | RECTAL | Status: DC

## 2015-03-11 MED ORDER — POTASSIUM CHLORIDE 20 MEQ PO PACK
40.0000 meq | PACK | Freq: Two times a day (BID) | ORAL | Status: DC
  Administered 2015-03-11 – 2015-03-13 (×4): 40 meq via ORAL
  Filled 2015-03-11 (×5): qty 2

## 2015-03-11 MED ORDER — POTASSIUM CHLORIDE IN NACL 20-0.9 MEQ/L-% IV SOLN
INTRAVENOUS | Status: AC
  Administered 2015-03-11: 09:00:00 via INTRAVENOUS
  Filled 2015-03-11: qty 1000

## 2015-03-11 NOTE — Care Management Important Message (Signed)
Important Message  Patient Details  Name: Regina Fields MRN: [id number] Date of Birth: 1928/08/30   Medicare Important Message Given:  Yes    Regina Fields 03/11/2015, 11:00 AM

## 2015-03-11 NOTE — Progress Notes (Signed)
Report called to [facility]. 3 L of oxygen. No tele. Takes meds crushed in apple sauce. Incontinent. Alert to self only. Family was made aware of room change.

## 2015-03-11 NOTE — Progress Notes (Signed)
PT Cancellation Note  Patient Details Name: RAVINDER HOFLAND MRN: [id number] DOB: 24-Jul-1928   Cancelled Treatment:    Reason Eval/Treat Not Completed: PT screened, no needs identified, will sign off. Chart reviewed and RN consulted. During prior admission pt was evaluation by physical therapy and found to be maxA/dependent for all bed mobility and seated balance. No needs identified at that time. Order received from current admission. Called RN at [facility] who primarily cares for Ms. Brashear. RN reports that pt is dependent care and requires mechanical lift for all transfers. Does not participate with therapy and has not ambulated in an extended time. Pt is not currently appropriate for physical therapy at this time. Order will be completed. Please enter new order if new information arises which would indicate pt would benefit from PT evaluation. Case discussed with CSW and care manager from unit.  Sharalyn Ink Huprich PT, DPT   Huprich,Jason 03/11/2015, 2:29 PM

## 2015-03-11 NOTE — Progress Notes (Signed)
[facility] - [place] at [facility]   PATIENT NAME: Regina Fields    MR#:  [id number]  DATE OF BIRTH:  May 02, 1928  SUBJECTIVE:   Admitted for SOB with recurrent pneumonia. From SNF. Has dementia. Discharged 1/25 on RA.  still on 2-3 L O2.  Started on Dysphagia 2 diet  REVIEW OF SYSTEMS:   Review of Systems  Unable to perform ROS: dementia     DRUG ALLERGIES:   Allergies  Allergen Reactions  . Alendronate Shortness Of Breath  . Candesartan Shortness Of Breath  . Amoxicillin Diarrhea  . Atorvastatin     Other reaction(s): Muscle Pain  . Ceftriaxone     Other reaction(s): Muscle Pain  . Levofloxacin     Other reaction(s): Unknown  . Lisinopril     Other reaction(s): Unknown  . Losartan     Other reaction(s): Unknown  . Nizatidine     Other reaction(s): Other (See Comments) Blurred vision  . Nsaids     Other reaction(s): Unknown  . Omeprazole Magnesium Nausea Only  . Psyllium     Other reaction(s): Unknown  . Sertaconazole     Other reaction(s): Unknown  . Sulfa Antibiotics Rash    VITALS:  Blood pressure 128/48, pulse 76, temperature 98.2 F (36.8 C), temperature source Oral, resp. rate 20, height  (1.651 m), weight 61.689 kg (136 lb), SpO2 96 %.  PHYSICAL EXAMINATION:   Physical Exam  GENERAL:  82 y.o.-year-old patient lying in the bed with no acute distress.  EYES: Pupils equal, round, reactive to light and accommodation. No scleral icterus. Extraocular muscles intact.  HEENT: Head atraumatic, normocephalic. Oropharynx and nasopharynx clear.  NECK:  Supple, no jugular venous distention. No thyroid enlargement, no tenderness.  LUNGS: Coarse breath sounds bilaterally, no wheezing, rales, rhonchi. No use of accessory muscles of respiration.  CARDIOVASCULAR: S1, S2 normal. No murmurs, rubs, or gallops.  ABDOMEN: Soft, nontender, nondistended. Bowel sounds present. No organomegaly or mass.  EXTREMITIES: No cyanosis, clubbing or  edema b/l.    NEUROLOGIC: Cranial nerves II through XII are intact. No focal Motor or sensory deficits b/l.   PSYCHIATRIC:  patient is alert and awake. SKIN: No obvious rash, lesion, or ulcer.   LABORATORY PANEL:  CBC  Recent Labs Lab 03/11/15 0407  WBC 13.0*  HGB 11.2*  HCT 34.2*  PLT 172    Chemistries   Recent Labs Lab 03/09/15 1829  03/11/15 0407  NA 142  < > 137  K 3.9  < > 3.3*  CL 107  < > 100*  CO2 27  < > 28  GLUCOSE 146*  < > 108*  BUN 33*  < > 23*  CREATININE 1.45*  < > 1.39*  CALCIUM 8.7*  < > 8.6*  AST 29  --   --   ALT 32  --   --   ALKPHOS 62  --   --   BILITOT 0.3  --   --   < > = values in this interval not displayed. Cardiac Enzymes  Recent Labs Lab 03/09/15 1829  TROPONINI <0.03   RADIOLOGY:  Ct Chest W Contrast  03/09/2015  CLINICAL DATA:  Shortness of breath with elevated low blood cell count. Recent diagnosis of bronchitis. EXAM: CT CHEST WITH CONTRAST TECHNIQUE: Multidetector CT imaging of the chest was performed during intravenous contrast administration. CONTRAST:  50mL OMNIPAQUE IOHEXOL 300 MG/ML  SOLN COMPARISON:  Radiographs earlier this day, as well as 03/03/2015. Chest CT 09/11/2010  FINDINGS: Mediastinum/Lymph Nodes: Thoracic aorta normal in caliber, mild atherosclerosis. Conventional branching pattern from the aortic arch. Coronary artery calcifications are seen. No mediastinal or hilar adenopathy. No pericardial effusion. Lungs/Pleura: Respiratory motion in the lung apices and lower lobes. Progressive calcification of the tracheobronchial tree with bronchial thickening most significant in the lower lobes. Neck and pre bronchial occlusion in the posterior basal left lower lobe, may be secondary to bronchial thickening and mucous plugging. Scattered linear, patchy and slightly nodular opacities in the left lower lobe, periphery of the left upper lobe, and to a lesser extent periphery of the right lower lobe. Largest nodular component in the  left lower lobe measures 6 mm. No confluent airspace disease. No pleural effusion. Upper abdomen: There is a 9 mm hypodensity in the periportal liver, too small to accurately characterize. No acute abnormality. Musculoskeletal: Severe compression deformity of T12 vertebra, near vertebral plana appearance, likely chronic. Hemangioma within T8 vertebral body. There are no acute or suspicious osseous abnormalities. IMPRESSION: 1. Bronchial thickening superimposed on background bronchial calcification. This most significant in the left lower lobe were there is near complete bronchial occlusion in the basal portion, likely secondary to bronchial thickening, less likely mucous plugging. 2. Scattered linear, patchy, and slightly nodular opacities several both lungs, most significant at the left lung base. Most consistent with scattered pneumonitis, likely infectious or inflammatory. No confluent pneumonia. The largest nodular component in the left lower lobe measure 6 mm, and while likely infectious or inflammatory, is not definitively seen on prior CT and therefore follow-up is recommended. If the patient is at high risk for bronchogenic carcinoma, follow-up chest CT at 6-12 months is recommended. If the patient is at low risk for bronchogenic carcinoma, follow-up chest CT at 12 months is recommended. This recommendation follows the consensus statement: Guidelines for Management of Small Pulmonary Nodules Detected on CT Scans: A Statement from the Fleischner Society as published in Radiology 2005;237:395-400. Electronically Signed   By: Rubye Oaks M.D.   On: 03/09/2015 21:17   Dg Chest Portable 1 View  03/09/2015  CLINICAL DATA:  82 year old with diagnosis of acute bronchitis approximately 1 week ago with progressively worsening symptoms. Patient now has acute mental status changes with confusion. EXAM: PORTABLE CHEST 1 VIEW COMPARISON:  03/03/2015 and earlier. FINDINGS: Suboptimal inspiration accounts for  crowded bronchovascular markings diffusely and atelectasis in the bases, and accentuates the cardiac silhouette. Taking this into account, cardiac silhouette likely upper normal. Prominent bronchovascular markings diffusely and central peribronchial thickening, similar to the examination 6 days ago. No confluent airspace consolidation. IMPRESSION: 1. Stable moderate changes of bronchitis since the examination 6 days ago. 2. Suboptimal inspiration accounts for bibasilar atelectasis. 3.  No acute cardiopulmonary disease otherwise. Electronically Signed   By: Hulan Saas M.D.   On: 03/09/2015 18:53   ASSESSMENT AND PLAN:  Regina Fields is a 82 y.o. female with a known history of dementia brought in from [facility] [place] because of shortness of breath, increased respiratory rate up to 30s, hypoxia with O2 sats low on room air  # Acute hypoxic respiratory failure secondary to LLL HCAP - continue oxygen - On IV abx. - Continue nebulizers and inhalers. - Await cx results  # CKD 3 - stable  # Dementia stable  Case discussed with Care Management/Social Worker.  CODE STATUS: Full  DVT Prophylaxis: Lovenox  TOTAL TIME TAKING CARE OF THIS PATIENT: 35 minutes.   POSSIBLE D/C IN 2-3 DAYS, DEPENDING ON CLINICAL CONDITION.   Regina Fields, Forensic scientist R  M.D on 03/11/2015 at 1:58 PM  Between 7am to 6pm - Pager - [phone]  After 6pm go to [email or web address] - password [email or web address] [facility]  [facility]  Office  [phone]  CC: Primary care physician; No primary care provider on file.     Note: This dictation was prepared with Dragon dictation along with smaller phrase technology. Any transcriptional errors that result from this process are unintentional.

## 2015-03-11 NOTE — Plan of Care (Signed)
Problem: SLP Dysphagia Goals Goal: Misc Dysphagia Goal Pt will safely tolerate po diet of least restrictive consistency w/ no overt s/s of aspiration noted by Staff/pt/family x3 sessions.    

## 2015-03-11 NOTE — Evaluation (Signed)
Clinical/Bedside Swallow Evaluation Patient Details  Name: Regina Fields MRN: [id number] Date of Birth: 12-Nov-1928  Today's Date: 03/11/2015 Time: SLP Start Time (ACUTE ONLY): 0945 SLP Stop Time (ACUTE ONLY): 1045 SLP Time Calculation (min) (ACUTE ONLY): 60 min  Past Medical History:  Past Medical History  Diagnosis Date  . Dementia    Past Surgical History:  Past Surgical History  Procedure Laterality Date  . Tonsillectomy     HPI:  Pt is a 82 year old female with h/o of dementia, HTN, GERD, anxiety, vertigo, IBS, and other medical issues per chart noteswho was sent over from the nursing home. She was recently treated for pneumonia and discharge in 03/05/15 and his back with shortness of breath and hypoxia. The patient is unable to provide any meaningful history due to her dementia. In the ER her initial O2 sats was in the 80s. She's been treated for hospital-acquired pneumonia and the hospitalist have been requested to admit. Pt has significant Cognitive decline and required mod-max cues for follow through w/ tasks; smiled often.    Assessment / Plan / Recommendation Clinical Impression  Pt appeared to adequately tolerate trials of thin liquids, purees, and soft solids w/ no immediate, overt s/s of aspiration noted; no decline in respiratory status and no wet vocal quality b/t trials when she spoke. Oral phase c/b min. increased time for mastication and full clearing w/ increased texture of soft solids(softened w/ applesauce). Suspect this is d/t her declined Cognitive status. Given time, pt was able to clear orally w/ all trials. Pt required feeding assistance; she made no attempt to feed self. Pt appears at reduced risk for aspiration following general aspiration precautions. Rec. a dys. 2 w/ thin liquids; meds in puree - crushed as able. Rec. strict aspiration precautions d/t Cognitive decline. ST will f/u w/ toleration of diet as indicated. NSG to assist w/ feeding at meals.      Aspiration Risk  Mild aspiration risk (d/t Cognitive decline)    Diet Recommendation  Dys. 2 w/ thin liquids; aspiration precautions; reduce distractions; feeding assistance at all meals.   Medication Administration: Crushed with puree    Other  Recommendations Recommended Consults:  (Dietician) Oral Care Recommendations: Oral care BID;Staff/trained caregiver to provide oral care   Follow up Recommendations  Skilled Nursing facility (TBD)    Frequency and Duration min 2x/week  2 weeks       Prognosis Prognosis for Safe Diet Advancement: Fair Barriers to Reach Goals: Cognitive deficits;Severity of deficits      Swallow Study   General Date of Onset: 03/09/15 HPI: Pt is a 82 year old female with h/o of dementia, HTN, GERD, anxiety, vertigo, IBS, and other medical issues per chart noteswho was sent over from the nursing home. She was recently treated for pneumonia and discharge in 03/05/15 and his back with shortness of breath and hypoxia. The patient is unable to provide any meaningful history due to her dementia. In the ER her initial O2 sats was in the 80s. She's been treated for hospital-acquired pneumonia and the hospitalist have been requested to admit. Pt has significant Cognitive decline and required mod-max cues for follow through w/ tasks; smiled often.  Type of Study: Bedside Swallow Evaluation Previous Swallow Assessment: none noted Diet Prior to this Study:  (unknown; currently NPO) Temperature Spikes Noted: No (wbc 13.0) Respiratory Status: Nasal cannula (3 liters) History of Recent Intubation: No Behavior/Cognition: Pleasant mood;Confused;Distractible;Requires cueing (awake) Oral Cavity Assessment: Within Functional Limits;Dry (min.) Oral Care Completed by SLP: Yes Oral  Cavity - Dentition: Adequate natural dentition Vision:  (n/a) Self-Feeding Abilities: Total assist (Cognitive decline) Patient Positioning: Upright in bed Baseline Vocal Quality: Normal (when she  spoke) Volitional Cough: Cognitively unable to elicit Volitional Swallow: Unable to elicit    Oral/Motor/Sensory Function Overall Oral Motor/Sensory Function: Within functional limits (limited exam but appeared wfl)   Ice Chips Ice chips: Within functional limits Presentation: Spoon (fed; 3 trials)   Thin Liquid Thin Liquid: Within functional limits Presentation: Cup;Straw (assisted w/ feeding; ~6 ozs)    Nectar Thick Nectar Thick Liquid: Not tested   Honey Thick Honey Thick Liquid: Not tested   Puree Puree: Within functional limits Presentation: Spoon (fed; 6 trials) Pharyngeal Phase Impairments:  (none)   Solid   GO   Solid: Impaired (min.) Presentation: Spoon (fed; 4 trials) Oral Phase Functional Implications: Prolonged oral transit;Impaired mastication (min.) Pharyngeal Phase Impairments:  (none) Other Comments: softened foods were best       Jerilynn Som, MS, CCC-SLP  Watson,Katherine 03/11/2015,12:15 PM

## 2015-03-12 LAB — BASIC METABOLIC PANEL
Anion gap: 6 (ref 5–15)
BUN: 27 mg/dL — ABNORMAL HIGH (ref 6–20)
CHLORIDE: 108 mmol/L (ref 101–111)
CO2: 25 mmol/L (ref 22–32)
CREATININE: 1.61 mg/dL — AB (ref 0.44–1.00)
Calcium: 8.8 mg/dL — ABNORMAL LOW (ref 8.9–10.3)
GFR calc non Af Amer: 28 mL/min — ABNORMAL LOW (ref >60)
GFR, EST AFRICAN AMERICAN: 33 mL/min — AB (ref >60)
GLUCOSE: 117 mg/dL — AB (ref 65–99)
Potassium: 4.2 mmol/L (ref 3.5–5.1)
Sodium: 139 mmol/L (ref 135–145)

## 2015-03-12 MED ORDER — PIPERACILLIN-TAZOBACTAM 3.375 G IVPB
3.3750 g | Freq: Three times a day (TID) | INTRAVENOUS | Status: DC
  Administered 2015-03-12 – 2015-03-13 (×4): 3.375 g via INTRAVENOUS
  Filled 2015-03-12 (×5): qty 50

## 2015-03-12 MED ORDER — METHYLPREDNISOLONE SODIUM SUCC 125 MG IJ SOLR
60.0000 mg | INTRAMUSCULAR | Status: DC
  Administered 2015-03-12 – 2015-03-13 (×2): 60 mg via INTRAVENOUS
  Filled 2015-03-12 (×2): qty 2

## 2015-03-12 MED ORDER — IPRATROPIUM-ALBUTEROL 0.5-2.5 (3) MG/3ML IN SOLN
3.0000 mL | RESPIRATORY_TRACT | Status: DC
  Administered 2015-03-12 – 2015-03-13 (×8): 3 mL via RESPIRATORY_TRACT
  Filled 2015-03-12 (×8): qty 3

## 2015-03-12 NOTE — Progress Notes (Signed)
[facility] - [place] at [facility]   PATIENT NAME: Regina Fields    MR#:  [id number]  DATE OF BIRTH:  12-26-1928  SUBJECTIVE:   Admitted for SOB with recurrent pneumonia. From SNF. Has dementia. Discharged 1/25 on RA.  still on 2-3 L O2.  Started on Dysphagia 2 diet  More awake.  REVIEW OF SYSTEMS:   Review of Systems  Unable to perform ROS: dementia     DRUG ALLERGIES:   Allergies  Allergen Reactions  . Alendronate Shortness Of Breath  . Candesartan Shortness Of Breath  . Amoxicillin Diarrhea  . Atorvastatin     Other reaction(s): Muscle Pain  . Ceftriaxone     Other reaction(s): Muscle Pain  . Levofloxacin     Other reaction(s): Unknown  . Lisinopril     Other reaction(s): Unknown  . Losartan     Other reaction(s): Unknown  . Nizatidine     Other reaction(s): Other (See Comments) Blurred vision  . Nsaids     Other reaction(s): Unknown  . Omeprazole Magnesium Nausea Only  . Psyllium     Other reaction(s): Unknown  . Sertaconazole     Other reaction(s): Unknown  . Sulfa Antibiotics Rash    VITALS:  Blood pressure 119/36, pulse 89, temperature 98.6 F (37 C), temperature source Oral, resp. rate 18, height  (1.651 m), weight 61.689 kg (136 lb), SpO2 93 %.  PHYSICAL EXAMINATION:   Physical Exam  GENERAL:  82 y.o.-year-old patient lying in the bed with no acute distress.  EYES: Pupils equal, round, reactive to light and accommodation. No scleral icterus. Extraocular muscles intact.  HEENT: Head atraumatic, normocephalic. Oropharynx and nasopharynx clear.  NECK:  Supple, no jugular venous distention. No thyroid enlargement, no tenderness.  LUNGS: Coarse breath sounds bilaterally, no wheezing, rales, rhonchi. No use of accessory muscles of respiration.  CARDIOVASCULAR: S1, S2 normal. No murmurs, rubs, or gallops.  ABDOMEN: Soft, nontender, nondistended. Bowel sounds present. No organomegaly or mass.  EXTREMITIES: No  cyanosis, clubbing or edema b/l.    NEUROLOGIC: Cranial nerves II through XII are intact. No focal Motor or sensory deficits b/l.   PSYCHIATRIC:  patient is alert and awake. SKIN: No obvious rash, lesion, or ulcer.   LABORATORY PANEL:  CBC  Recent Labs Lab 03/11/15 0407  WBC 13.0*  HGB 11.2*  HCT 34.2*  PLT 172    Chemistries   Recent Labs Lab 03/09/15 1829  03/12/15 0551  NA 142  < > 139  K 3.9  < > 4.2  CL 107  < > 108  CO2 27  < > 25  GLUCOSE 146*  < > 117*  BUN 33*  < > 27*  CREATININE 1.45*  < > 1.61*  CALCIUM 8.7*  < > 8.8*  AST 29  --   --   ALT 32  --   --   ALKPHOS 62  --   --   BILITOT 0.3  --   --   < > = values in this interval not displayed. Cardiac Enzymes  Recent Labs Lab 03/09/15 1829  TROPONINI <0.03   RADIOLOGY:  No results found. ASSESSMENT AND PLAN:  Regina Fields is a 82 y.o. female with a known history of dementia brought in from [facility] [place] because of shortness of breath, increased respiratory rate up to 30s, hypoxia with O2 sats low on room air  # Acute hypoxic respiratory failure secondary to LLL HCAP - continue oxygen - On IV  abx. - Continue nebulizers and inhalers. - Await cx results D/C vancomycin Start solumedrol and start nebs Scheduled  # CKD 3 - stable  # Dementia stable  Case discussed with Care Management/Social Worker.  CODE STATUS: Full  DVT Prophylaxis: Lovenox  TOTAL TIME TAKING CARE OF THIS PATIENT: 35 minutes.   D/C in AM   Milagros Loll R M.D on 03/12/2015 at 9:58 AM  Between 7am to 6pm - Pager - [phone]  After 6pm go to [email or web address] - password [email or web address] [facility]  [place]  Hospitalists  Office  [phone]  CC: Primary care physician; No primary care provider on file.     Note: This dictation was prepared with Dragon dictation along with smaller phrase technology. Any transcriptional errors that result from this process are unintentional.

## 2015-03-12 NOTE — Progress Notes (Signed)
Pharmacy Antibiotic Note  Regina Fields is a 82 y.o. female admitted on 03/09/2015 with pneumonia.  Pharmacy has been consulted for Zosyn dosing.  Plan: Zosyn 3.375g IV q8h (4 hour infusion).  Will transition to 3.375 gm from 4.5 gm IV q8h per EI protocol as BCx have shown NG x 2 days.   Height:  (165.1 cm) Weight: 136 lb (61.689 kg) IBW/kg (Calculated) : 57  Temp (24hrs), Avg:98.7 F (37.1 C), Min:97.5 F (36.4 C), Max:99.9 F (37.7 C)   Recent Labs Lab 03/09/15 1829 03/09/15 1929 03/10/15 0420 03/11/15 0407 03/12/15 0551  WBC 18.1*  --  17.1* 13.0*  --   CREATININE 1.45*  --  1.31* 1.39* 1.61*  LATICACIDVEN  --  1.7  --   --   --     Estimated Creatinine Clearance: 23.4 mL/min (by C-G formula based on Cr of 1.61).    Allergies  Allergen Reactions  . Alendronate Shortness Of Breath  . Candesartan Shortness Of Breath  . Amoxicillin Diarrhea  . Atorvastatin     Other reaction(s): Muscle Pain  . Ceftriaxone     Other reaction(s): Muscle Pain  . Levofloxacin     Other reaction(s): Unknown  . Lisinopril     Other reaction(s): Unknown  . Losartan     Other reaction(s): Unknown  . Nizatidine     Other reaction(s): Other (See Comments) Blurred vision  . Nsaids     Other reaction(s): Unknown  . Omeprazole Magnesium Nausea Only  . Psyllium     Other reaction(s): Unknown  . Sertaconazole     Other reaction(s): Unknown  . Sulfa Antibiotics Rash    Antimicrobials this admission: Anti-infectives    Start     Dose/Rate Route Frequency Ordered Stop   03/12/15 1400  piperacillin-tazobactam (ZOSYN) IVPB 3.375 g     3.375 g 12.5 mL/hr over 240 Minutes Intravenous 3 times per day 03/12/15 1045     03/10/15 1500  vancomycin (VANCOCIN) IVPB 1000 mg/200 mL premix  Status:  Discontinued     1,000 mg 200 mL/hr over 60 Minutes Intravenous Every 36 hours 03/10/15 0041 03/11/15 1438   03/10/15 0600  piperacillin-tazobactam (ZOSYN) IVPB 4.5 g  Status:  Discontinued     4.5 g 25 mL/hr over 240 Minutes Intravenous 3 times per day 03/10/15 0044 03/12/15 1045   03/10/15 0016  doxycycline (VIBRAMYCIN) 100 mg in dextrose 5 % 250 mL IVPB     100 mg 125 mL/hr over 120 Minutes Intravenous Every 12 hours 03/10/15 0016     03/09/15 2145  piperacillin-tazobactam (ZOSYN) IVPB 3.375 g     3.375 g 100 mL/hr over 30 Minutes Intravenous  Once 03/09/15 2143 03/09/15 2310   03/09/15 2145  vancomycin (VANCOCIN) IVPB 1000 mg/200 mL premix     1,000 mg 200 mL/hr over 60 Minutes Intravenous  Once 03/09/15 2143 03/11/15 0924      Dose adjustments this admission: Transition to Zosyn 3.375 gm IV q8h per EI protocol  Microbiology results: Results for orders placed or performed during the hospital encounter of 03/09/15  Culture, blood (routine x 2)     Status: None (Preliminary result)   Collection Time: 03/09/15  6:29 PM  Result Value Ref Range Status   Specimen Description BLOOD LEFT ARM  Final   Special Requests BOTTLES DRAWN AEROBIC AND ANAEROBIC  1CC  Final   Culture NO GROWTH 2 DAYS  Final   Report Status PENDING  Incomplete  Culture, blood (routine x  2)     Status: None (Preliminary result)   Collection Time: 03/09/15  6:29 PM  Result Value Ref Range Status   Specimen Description BLOOD RIGHT WRIST  Final   Special Requests BOTTLES DRAWN AEROBIC AND ANAEROBIC  2CC  Final   Culture NO GROWTH 2 DAYS  Final   Report Status PENDING  Incomplete  Urine culture     Status: None   Collection Time: 03/09/15  7:27 PM  Result Value Ref Range Status   Specimen Description URINE, RANDOM  Final   Special Requests NONE  Final   Culture NO GROWTH 1 DAY  Final   Report Status 03/11/2015 FINAL  Final  MRSA PCR Screening     Status: None   Collection Time: 03/10/15 12:43 AM  Result Value Ref Range Status   MRSA by PCR NEGATIVE NEGATIVE Final    Comment:        The GeneXpert MRSA Assay (FDA approved for NASAL specimens only), is one component of a comprehensive MRSA  colonization surveillance program. It is not intended to diagnose MRSA infection nor to guide or monitor treatment for MRSA infections.     Thank you for allowing pharmacy to be a part of this patient's care.  Tya Haughey G 03/12/2015 10:46 AM

## 2015-03-12 NOTE — Plan of Care (Signed)
Problem: Education: Goal: Knowledge of disease or condition will improve Outcome: Not Progressing Pt oriented to self, unable to teach new concepts in reference to her condition.   Problem: Health Behavior/Discharge Planning: Goal: Ability to manage health-related needs will improve Outcome: Not Progressing Pt at her baseline. No family were here tonight to provide education or assess there knowledge of the patients disease process.   Problem: Coping: Goal: Verbalizations of decreased anxiety will increase Outcome: Not Progressing Pt still very nervous and anxious. Yells out with the slightest touch.   Problem: Respiratory: Goal: Identification of resources available to assist in meeting health care needs will improve Outcome: Not Progressing Needs to be discussed with family during regular hours, no family here overnight. Daughter-in-law called. Unable to speak to her earlier. Called her back and no answer.   Problem: Education: Goal: Knowledge of [facility] Education information/materials will improve Outcome: Not Progressing See previous documentation

## 2015-03-13 MED ORDER — IPRATROPIUM-ALBUTEROL 0.5-2.5 (3) MG/3ML IN SOLN: 3.0000 mL | Freq: Four times a day (QID) | RESPIRATORY_TRACT | Status: AC

## 2015-03-13 MED ORDER — AMOXICILLIN-POT CLAVULANATE 250-62.5 MG/5ML PO SUSR: 500.0000 mg | Freq: Two times a day (BID) | ORAL | Status: DC

## 2015-03-13 MED ORDER — AZITHROMYCIN 250 MG PO TABS: 250.0000 mg | ORAL_TABLET | Freq: Every day | ORAL | Status: DC

## 2015-03-13 MED ORDER — PREDNISONE 50 MG PO TABS: 50.0000 mg | ORAL_TABLET | Freq: Every day | ORAL | Status: DC

## 2015-03-13 NOTE — Care Management Important Message (Signed)
Important Message  Patient Details  Name: Regina Fields MRN: [id number] Date of Birth: [birth year]   Medicare Important Message Given:  Yes    Olegario Messier A Lynsay Fesperman 03/13/2015, 9:55 AM

## 2015-03-13 NOTE — Progress Notes (Signed)
Patient is discharge to SNF in a stable condition, report given to floor nurse left by EMS

## 2015-03-13 NOTE — Discharge Summary (Addendum)
[facility] - [place] at [facility]   PATIENT NAME: Regina Fields    MR#:  [id number]  DATE OF BIRTH:  Mar 25, 1928  DATE OF ADMISSION:  03/09/2015 ADMITTING PHYSICIAN: Gery Pray, MD  DATE OF DISCHARGE: 03/13/2015  PRIMARY CARE PHYSICIAN: No primary care provider on file.    ADMISSION DIAGNOSIS:  HCAP (healthcare-associated pneumonia) [J18.9]  DISCHARGE DIAGNOSIS:  Principal Problem:   Healthcare-associated pneumonia Active Problems:   HTN (hypertension)   Dementia   Dyslipidemia   HCAP (healthcare-associated pneumonia)   SECONDARY DIAGNOSIS:   Past Medical History  Diagnosis Date  . Dementia      ADMITTING HISTORY  This is a 82 year old female with dementia who was sent over from the nursing home. She was recently treated for pneumonia and discharge in 02/2515 and his back with shortness of breath and hypoxia. The patient is unable to provide any meaningful history due to her dementia. In the ER her initial O2 sats was in the 80s. She's been treated for hospital-acquired pneumonia and the hospitalist have been requested to admit   HOSPITAL COURSE:   Xiadani Damman is a 82 y.o. female with a known history of dementia brought in from [facility] because of shortness of breath, increased respiratory rate up to 30s, hypoxia with O2 sats low on room air  # Acute hypoxic respiratory failure secondary to LLL HCAP Possibly aspiration pneumonia - continue oxygen - On IV abx. - Continue nebulizers and inhalers. - cx results - NGTD D/C vancomycin Change to Augmentin for 1 week.   Had swallow eval done Started on Dysphagia 2 diet.  # CKD 3 - stable  # Dementia stable  Stable for discharge to SNF on Abx and O2.   CONSULTS OBTAINED:     DRUG ALLERGIES:   Allergies  Allergen Reactions  . Alendronate Shortness Of Breath  . Candesartan Shortness Of Breath  . Amoxicillin Diarrhea  . Atorvastatin     Other reaction(s): Muscle Pain  .  Ceftriaxone     Other reaction(s): Muscle Pain  . Levofloxacin     Other reaction(s): Unknown  . Lisinopril     Other reaction(s): Unknown  . Losartan     Other reaction(s): Unknown  . Nizatidine     Other reaction(s): Other (See Comments) Blurred vision  . Nsaids     Other reaction(s): Unknown  . Omeprazole Magnesium Nausea Only  . Psyllium     Other reaction(s): Unknown  . Sertaconazole     Other reaction(s): Unknown  . Sulfa Antibiotics Rash    DISCHARGE MEDICATIONS:   Current Discharge Medication List    START taking these medications   Details  amoxicillin-clavulanate (AUGMENTIN) 250-62.5 MG/5ML suspension Take 10 mLs (500 mg total) by mouth 2 (two) times daily. Qty: 140 mL, Refills: 0    ipratropium-albuterol (DUONEB) 0.5-2.5 (3) MG/3ML SOLN Take 3 mLs by nebulization every 6 (six) hours. Qty: 360 mL    predniSONE (DELTASONE) 50 MG tablet Take 1 tablet (50 mg total) by mouth daily with breakfast. Qty: 5 tablet, Refills: 0      CONTINUE these medications which have CHANGED   Details  azithromycin (ZITHROMAX) 250 MG tablet Take 1 tablet (250 mg total) by mouth daily. Po daily for 5 days Qty: 5 each, Refills: 0      CONTINUE these medications which have NOT CHANGED   Details  acetaminophen (TYLENOL) 325 MG tablet Take 650 mg by mouth every 4 (four) hours as needed for mild pain,  moderate pain or fever.    albuterol (PROVENTIL HFA;VENTOLIN HFA) 108 (90 Base) MCG/ACT inhaler Inhale 2 puffs into the lungs every 4 (four) hours as needed for wheezing.    amLODipine (NORVASC) 5 MG tablet Take 5 mg by mouth daily.    aspirin 81 MG chewable tablet Chew 81 mg by mouth daily.    atenolol (TENORMIN) 25 MG tablet Take 25 mg by mouth daily.    furosemide (LASIX) 40 MG tablet Take 40 mg by mouth daily.    levothyroxine (SYNTHROID, LEVOTHROID) 137 MCG tablet Take 137 mcg by mouth daily before breakfast.    montelukast (SINGULAIR) 10 MG tablet Take 10 mg by mouth every  evening.    Multiple Vitamin (THEREMS) TABS Take 1 tablet by mouth daily.    NON FORMULARY Take 120 mLs by mouth 2 (two) times daily. *Medpass 2.0*    potassium chloride (K-DUR) 10 MEQ tablet Take 10 mEq by mouth daily.    SALINE NASAL SPRAY NA Place 2 sprays into the nose daily as needed (for allergies.).    traMADol (ULTRAM) 50 MG tablet Take 50 mg by mouth 2 (two) times daily.         Today    VITAL SIGNS:  Blood pressure 124/58, pulse 104, temperature 98.5 F (36.9 C), temperature source Oral, resp. rate 18, height  (1.651 m), weight 61.689 kg (136 lb), SpO2 96 %.  I/O:  No intake or output data in the 24 hours ending 03/13/15 1153  PHYSICAL EXAMINATION:  Physical Exam  GENERAL:  82 y.o.-year-old patient lying in the bed with no acute distress.  LUNGS: Good air entry. Ronchi bilateral. CARDIOVASCULAR: S1, S2 normal. No murmurs, rubs, or gallops.  ABDOMEN: Soft, non-tender, non-distended. Bowel sounds present. No organomegaly or mass.  NEUROLOGIC: Moves all 4 extremities. PSYCHIATRIC: The patient is alert and awake. Confused. SKIN: No obvious rash, lesion, or ulcer.   DATA REVIEW:   CBC  Recent Labs Lab 03/11/15 0407  WBC 13.0*  HGB 11.2*  HCT 34.2*  PLT 172    Chemistries   Recent Labs Lab 03/09/15 1829  03/12/15 0551  NA 142  < > 139  K 3.9  < > 4.2  CL 107  < > 108  CO2 27  < > 25  GLUCOSE 146*  < > 117*  BUN 33*  < > 27*  CREATININE 1.45*  < > 1.61*  CALCIUM 8.7*  < > 8.8*  AST 29  --   --   ALT 32  --   --   ALKPHOS 62  --   --   BILITOT 0.3  --   --   < > = values in this interval not displayed.  Cardiac Enzymes  Recent Labs Lab 03/09/15 1829  TROPONINI <0.03    Microbiology Results  Results for orders placed or performed during the hospital encounter of 03/09/15  Culture, blood (routine x 2)     Status: None (Preliminary result)   Collection Time: 03/09/15  6:29 PM  Result Value Ref Range Status   Specimen Description  BLOOD LEFT ARM  Final   Special Requests BOTTLES DRAWN AEROBIC AND ANAEROBIC  1CC  Final   Culture NO GROWTH 4 DAYS  Final   Report Status PENDING  Incomplete  Culture, blood (routine x 2)     Status: None (Preliminary result)   Collection Time: 03/09/15  6:29 PM  Result Value Ref Range Status   Specimen Description BLOOD RIGHT WRIST  Final   Special Requests BOTTLES DRAWN AEROBIC AND ANAEROBIC  2CC  Final   Culture NO GROWTH 4 DAYS  Final   Report Status PENDING  Incomplete  Urine culture     Status: None   Collection Time: 03/09/15  7:27 PM  Result Value Ref Range Status   Specimen Description URINE, RANDOM  Final   Special Requests NONE  Final   Culture NO GROWTH 1 DAY  Final   Report Status 03/11/2015 FINAL  Final  MRSA PCR Screening     Status: None   Collection Time: 03/10/15 12:43 AM  Result Value Ref Range Status   MRSA by PCR NEGATIVE NEGATIVE Final    Comment:        The GeneXpert MRSA Assay (FDA approved for NASAL specimens only), is one component of a comprehensive MRSA colonization surveillance program. It is not intended to diagnose MRSA infection nor to guide or monitor treatment for MRSA infections.     RADIOLOGY:  No results found.    Follow up with PCP in 1 week.  Management plans discussed with the patient, family and they are in agreement.  CODE STATUS:     Code Status Orders        Start     Ordered   03/10/15 0017  Full code   Continuous     03/10/15 0016    Code Status History    Date Active Date Inactive Code Status Order ID Comments User Context   03/01/2015  6:51 PM 03/05/2015  4:42 PM Full Code [id number]  Katha Hamming, MD ED      TOTAL TIME TAKING CARE OF THIS PATIENT ON DAY OF DISCHARGE: more than 30 minutes.    Milagros Loll R M.D on 03/13/2015 at 11:53 AM  Between 7am to 6pm - Pager - [phone]  After 6pm go to [email or web address] - password [email or web address] [facility]  [facility]  Office   [phone]  CC: Primary care physician; No primary care provider on file.     Note: This dictation was prepared with Dragon dictation along with smaller phrase technology. Any transcriptional errors that result from this process are unintentional.

## 2015-03-13 NOTE — Plan of Care (Signed)
Problem: Activity: Goal: Ability to tolerate increased activity will improve Outcome: Not Progressing Pt was bedbound at [facility]. This may very well be patient baseline as far as activity level and tolerance. Passive ROM given.   Problem: Education: Goal: Knowledge of disease or condition will improve Outcome: Not Progressing Pt with baseline history of dementia. Oriented to self. Unable to complete goal.  Goal: Knowledge of the prescribed therapeutic regimen will improve Outcome: Not Progressing See previous entry.   Problem: Coping: Goal: Verbalizations of decreased anxiety will increase Outcome: Not Progressing Pt anxious and cries out with a mere touch. Giving pt explanation prior to providing care helps alleviate some anxiety.   Problem: Pain Management: Goal: Expressions of feelings of enhanced comfort will increase Outcome: Not Progressing Pt unable to state her needs. Oriented to self. Pt baseline.

## 2015-03-13 NOTE — Discharge Instructions (Signed)
°  DIET:  Regular diet Dys. 2 w/ thin liquids; aspiration precautions; reduce distractions; feeding assistance at all meals.   Medication Administration: Crushed with puree   DISCHARGE CONDITION:  Fair  ACTIVITY:  Activity as tolerated  OXYGEN:  Home Oxygen: Yes.     Oxygen Delivery: 2 liters/min via Patient connected to nasal cannula oxygen  DISCHARGE LOCATION:  nursing home   If you experience worsening of your admission symptoms, develop shortness of breath, life threatening emergency, suicidal or homicidal thoughts you must seek medical attention immediately by calling 911 or calling your MD immediately  if symptoms less severe.  You Must read complete instructions/literature along with all the possible adverse reactions/side effects for all the Medicines you take and that have been prescribed to you. Take any new Medicines after you have completely understood and accpet all the possible adverse reactions/side effects.   Please note  You were cared for by a hospitalist during your hospital stay. If you have any questions about your discharge medications or the care you received while you were in the hospital after you are discharged, you can call the unit and asked to speak with the hospitalist on call if the hospitalist that took care of you is not available. Once you are discharged, your primary care physician will handle any further medical issues. Please note that NO REFILLS for any discharge medications will be authorized once you are discharged, as it is imperative that you return to your primary care physician (or establish a relationship with a primary care physician if you do not have one) for your aftercare needs so that they can reassess your need for medications and monitor your lab values.

## 2015-03-13 NOTE — Clinical Social Work Note (Signed)
Pt is ready for discharge today and will return to [facility]. CSW attempted several times (8 in total, messages left requesting a return phone call) to reach pt's POA regarding discharge. Per chart review, pt's POA is aware of pt's admission. Facility is able to accept pt as facility has received discharge information. RN to call report. EMS will provide transportation. CSW will continue attempting to notifying family. CSW will sign off at this time.  Dede Query, MSW, LCSW Clinical Social worker  [phone]

## 2015-03-14 LAB — CULTURE, BLOOD (ROUTINE X 2)
CULTURE: NO GROWTH
Culture: NO GROWTH

## 2015-12-13 ENCOUNTER — Emergency Department: Payer: Medicare Other

## 2015-12-13 ENCOUNTER — Emergency Department
Admission: EM | Admit: 2015-12-13 | Discharge: 2015-12-13 | Disposition: A | Discharge status: Home / Self Care | Payer: Medicare Other | Attending: Emergency Medicine | Admitting: Emergency Medicine

## 2015-12-13 DIAGNOSIS — Z7982 Long term (current) use of aspirin: Secondary | ICD-10-CM | POA: Insufficient documentation

## 2015-12-13 DIAGNOSIS — J40 Bronchitis, not specified as acute or chronic: Secondary | ICD-10-CM | POA: Diagnosis not present

## 2015-12-13 DIAGNOSIS — Z79899 Other long term (current) drug therapy: Secondary | ICD-10-CM | POA: Insufficient documentation

## 2015-12-13 DIAGNOSIS — F0390 Unspecified dementia without behavioral disturbance: Secondary | ICD-10-CM | POA: Insufficient documentation

## 2015-12-13 DIAGNOSIS — I10 Essential (primary) hypertension: Secondary | ICD-10-CM | POA: Insufficient documentation

## 2015-12-13 DIAGNOSIS — R531 Weakness: Secondary | ICD-10-CM

## 2015-12-13 DIAGNOSIS — R0602 Shortness of breath: Secondary | ICD-10-CM

## 2015-12-13 DIAGNOSIS — R140 Abdominal distension (gaseous): Secondary | ICD-10-CM | POA: Diagnosis not present

## 2015-12-13 DIAGNOSIS — R600 Localized edema: Secondary | ICD-10-CM | POA: Diagnosis not present

## 2015-12-13 LAB — CBC WITH DIFFERENTIAL/PLATELET
BASOS ABS: 0 10*3/uL (ref 0–0.1)
BASOS PCT: 0 %
Band Neutrophils: 0 %
Blasts: 0 %
EOS PCT: 9 %
Eosinophils Absolute: 1.2 10*3/uL — ABNORMAL HIGH (ref 0–0.7)
HCT: 33.7 % — ABNORMAL LOW (ref 35.0–47.0)
Hemoglobin: 11.6 g/dL — ABNORMAL LOW (ref 12.0–16.0)
LYMPHS ABS: 6.5 10*3/uL — AB (ref 1.0–3.6)
Lymphocytes Relative: 50 %
MCH: 29.2 pg (ref 26.0–34.0)
MCHC: 34.3 g/dL (ref 32.0–36.0)
MCV: 85 fL (ref 80.0–100.0)
METAMYELOCYTES PCT: 0 %
MONO ABS: 1 10*3/uL — AB (ref 0.2–0.9)
MYELOCYTES: 0 %
Monocytes Relative: 8 %
Neutro Abs: 4.3 10*3/uL (ref 1.4–6.5)
Neutrophils Relative %: 33 %
Other: 0 %
PLATELETS: 151 10*3/uL (ref 150–440)
Promyelocytes Absolute: 0 %
RBC: 3.97 MIL/uL (ref 3.80–5.20)
RDW: 15.1 % — ABNORMAL HIGH (ref 11.5–14.5)
WBC: 13 10*3/uL — ABNORMAL HIGH (ref 3.6–11.0)
nRBC: 0 /100 WBC

## 2015-12-13 LAB — URINALYSIS COMPLETE WITH MICROSCOPIC (ARMC ONLY)
BILIRUBIN URINE: NEGATIVE
GLUCOSE, UA: NEGATIVE mg/dL
HGB URINE DIPSTICK: NEGATIVE
KETONES UR: NEGATIVE mg/dL
NITRITE: NEGATIVE
Protein, ur: NEGATIVE mg/dL
SPECIFIC GRAVITY, URINE: 1.01 (ref 1.005–1.030)
pH: 5 (ref 5.0–8.0)

## 2015-12-13 LAB — LIPASE, BLOOD: Lipase: 30 U/L (ref 11–51)

## 2015-12-13 LAB — TSH: TSH: 4.641 u[IU]/mL — ABNORMAL HIGH (ref 0.350–4.500)

## 2015-12-13 LAB — COMPREHENSIVE METABOLIC PANEL
ALT: 21 U/L (ref 14–54)
AST: 29 U/L (ref 15–41)
Albumin: 3.8 g/dL (ref 3.5–5.0)
Alkaline Phosphatase: 56 U/L (ref 38–126)
Anion gap: 10 (ref 5–15)
BUN: 39 mg/dL — ABNORMAL HIGH (ref 6–20)
CHLORIDE: 108 mmol/L (ref 101–111)
CO2: 25 mmol/L (ref 22–32)
CREATININE: 1.28 mg/dL — AB (ref 0.44–1.00)
Calcium: 9.1 mg/dL (ref 8.9–10.3)
GFR calc non Af Amer: 37 mL/min — ABNORMAL LOW (ref >60)
GFR, EST AFRICAN AMERICAN: 43 mL/min — AB (ref >60)
Glucose, Bld: 115 mg/dL — ABNORMAL HIGH (ref 65–99)
POTASSIUM: 4.5 mmol/L (ref 3.5–5.1)
SODIUM: 143 mmol/L (ref 135–145)
Total Bilirubin: 0.2 mg/dL — ABNORMAL LOW (ref 0.3–1.2)
Total Protein: 7.9 g/dL (ref 6.5–8.1)

## 2015-12-13 LAB — BRAIN NATRIURETIC PEPTIDE: B Natriuretic Peptide: 38 pg/mL (ref 0.0–100.0)

## 2015-12-13 LAB — TROPONIN I

## 2015-12-13 MED ORDER — ALBUTEROL SULFATE (2.5 MG/3ML) 0.083% IN NEBU
2.5000 mg | INHALATION_SOLUTION | Freq: Once | RESPIRATORY_TRACT | Status: AC
  Administered 2015-12-13: 2.5 mg via RESPIRATORY_TRACT
  Filled 2015-12-13: qty 3

## 2015-12-13 MED ORDER — PREDNISONE 20 MG PO TABS
20.0000 mg | ORAL_TABLET | Freq: Every day | ORAL | 0 refills | Status: DC
Start: 2015-12-14 — End: 2016-01-10

## 2015-12-13 MED ORDER — PREDNISONE 20 MG PO TABS
50.0000 mg | ORAL_TABLET | Freq: Once | ORAL | Status: AC
  Administered 2015-12-13: 50 mg via ORAL
  Filled 2015-12-13: qty 2

## 2015-12-13 MED ORDER — AZITHROMYCIN 500 MG PO TABS
500.0000 mg | ORAL_TABLET | Freq: Once | ORAL | Status: AC
  Administered 2015-12-13: 500 mg via ORAL
  Filled 2015-12-13: qty 1

## 2015-12-13 MED ORDER — AZITHROMYCIN 250 MG PO TABS: ORAL_TABLET | ORAL | 0 refills | Status: AC

## 2015-12-13 MED ORDER — ALBUTEROL SULFATE HFA 108 (90 BASE) MCG/ACT IN AERS: 2.0000 | INHALATION_SPRAY | Freq: Four times a day (QID) | RESPIRATORY_TRACT | 0 refills | Status: AC | PRN

## 2015-12-13 NOTE — ED Notes (Signed)
Pt moved to hallway awaiting transport back to [facility]

## 2015-12-13 NOTE — ED Triage Notes (Signed)
Pt presents from [facility] with increased lethargy and "fluid on lungs" per EMS report. Pt mental status at baseline per EMS report. Baseline altered.

## 2015-12-13 NOTE — ED Notes (Signed)
Attempted to call [facility] x[facility]. Unable to get through.

## 2015-12-13 NOTE — ED Notes (Signed)
EMS at bedside to transport back to [facility] 

## 2015-12-13 NOTE — ED Notes (Signed)
Spoke Kerrin ChampagneEva Slappey, pts daughter in law.

## 2015-12-13 NOTE — ED Provider Notes (Signed)
[facility] Emergency Department Provider Note  ____________________________________________   First MD Initiated Contact with Patient 12/13/15 1505     (approximate)  I have reviewed the triage vital signs and the nursing notes.   HISTORY  Chief Complaint Fatigue and Shortness of Breath   HPI Regina Fields is a 82 y.o. female with a history of dementia as well as hypertension who is presenting to the emergency department today with weakness as well as abnormal lung sounds. Per her care facility the patient was found to be slightly weaker than normal today. She is minimally verbal at her baseline, only responding to questions with yes or no questions and screaming whenever she is moved.  Per paperwork she has not been eating anything and was also found to have wheezing/rales on exam today. The patient is unable to give any further description of her symptoms because of her advanced dementia.   Past Medical History:  Diagnosis Date  . Dementia     Patient Active Problem List   Diagnosis Date Noted  . Healthcare-associated pneumonia 03/09/2015  . HTN (hypertension) 03/09/2015  . Dementia 03/09/2015  . Dyslipidemia 03/09/2015  . HCAP (healthcare-associated pneumonia) 03/09/2015  . Acute respiratory failure (HCC) 03/01/2015    Past Surgical History:  Procedure Laterality Date  . TONSILLECTOMY      Prior to Admission medications   Medication Sig Start Date End Date Taking? Authorizing Provider  acetaminophen (TYLENOL) 325 MG tablet Take 650 mg by mouth every 4 (four) hours as needed for mild pain, moderate pain or fever.    Historical Provider, MD  albuterol (PROVENTIL HFA;VENTOLIN HFA) 108 (90 Base) MCG/ACT inhaler Inhale 2 puffs into the lungs every 4 (four) hours as needed for wheezing.    Historical Provider, MD  amLODipine (NORVASC) 5 MG tablet Take 5 mg by mouth daily.    Historical Provider, MD  amoxicillin-clavulanate (AUGMENTIN) 250-62.5  MG/5ML suspension Take 10 mLs (500 mg total) by mouth 2 (two) times daily. 03/13/15   Milagros Loll, MD  aspirin 81 MG chewable tablet Chew 81 mg by mouth daily.    Historical Provider, MD  atenolol (TENORMIN) 25 MG tablet Take 25 mg by mouth daily.    Historical Provider, MD  azithromycin (ZITHROMAX) 250 MG tablet Take 1 tablet (250 mg total) by mouth daily. Po daily for 5 days 03/13/15   Milagros Loll, MD  furosemide (LASIX) 40 MG tablet Take 40 mg by mouth daily.    Historical Provider, MD  ipratropium-albuterol (DUONEB) 0.5-2.5 (3) MG/3ML SOLN Take 3 mLs by nebulization every 6 (six) hours. 03/13/15   Milagros Loll, MD  levothyroxine (SYNTHROID, LEVOTHROID) 137 MCG tablet Take 137 mcg by mouth daily before breakfast.    Historical Provider, MD  montelukast (SINGULAIR) 10 MG tablet Take 10 mg by mouth every evening.    Historical Provider, MD  Multiple Vitamin (THEREMS) TABS Take 1 tablet by mouth daily.    Historical Provider, MD  NON FORMULARY Take 120 mLs by mouth 2 (two) times daily. *Medpass 2.0*    Historical Provider, MD  potassium chloride (K-DUR) 10 MEQ tablet Take 10 mEq by mouth daily.    Historical Provider, MD  predniSONE (DELTASONE) 50 MG tablet Take 1 tablet (50 mg total) by mouth daily with breakfast. 03/13/15   Milagros Loll, MD  SALINE NASAL SPRAY NA Place 2 sprays into the nose daily as needed (for allergies.).    Historical Provider, MD  traMADol (ULTRAM) 50 MG tablet Take 50  mg by mouth 2 (two) times daily.    Historical Provider, MD    Allergies Alendronate; Candesartan; Amoxicillin; Atorvastatin; Ceftriaxone; Levofloxacin; Lisinopril; Losartan; Nizatidine; Nsaids; Omeprazole magnesium; Psyllium; Sertaconazole; and Sulfa antibiotics  History reviewed. No pertinent family history.  Social History Social History  Substance Use Topics  . Smoking status: Never Smoker  . Smokeless tobacco: Never Used  . Alcohol use Not on file    Review of Systems  Level V caveat secondary  to dementia.  ____________________________________________   PHYSICAL EXAM:  VITAL SIGNS: ED Triage Vitals  Enc Vitals Group     BP 12/13/15 1514 114/62     Pulse Rate 12/13/15 1514 71     Resp 12/13/15 1514 16     Temp 12/13/15 1514 99 F (37.2 C)     Temp Source 12/13/15 1514 Oral     SpO2 12/13/15 1514 95 %     Weight 12/13/15 1516 140 lb (63.5 kg)     Height 12/13/15 1516 5\' 1"  (1.549 m)     Head Circumference --      Peak Flow --      Pain Score --      Pain Loc --      Pain Edu? --      Excl. in GC? --     Constitutional: Alert . Well appearing and in no acute distress. Eyes: Conjunctivae are normal. PERRL. EOMI. Head: Atraumatic. Nose: No congestion/rhinnorhea. Mouth/Throat: Mucous membranes are moist.   Neck: No stridor.   Cardiovascular: Normal rate, regular rhythm. Grossly normal heart sounds.   Respiratory: Normal respiratory effort.  No retractions.Rales in bilateral lung fields up to the mid fields. Gastrointestinal: Mild and diffuse abdominal tenderness to palpation with moderate distention. Musculoskeletal: Mild to moderate bilateral lower extremity edema.  No joint effusions. Neurologic:  Normal speech and language. No gross focal neurologic deficits are appreciated.  Skin:  Skin is warm, dry and intact. No rash noted.   ____________________________________________   LABS (all labs ordered are listed, but only abnormal results are displayed)  Labs Reviewed  TSH  CBC WITH DIFFERENTIAL/PLATELET  COMPREHENSIVE METABOLIC PANEL  LIPASE, BLOOD  TROPONIN I  URINALYSIS COMPLETEWITH MICROSCOPIC ([facility] ONLY)   ____________________________________________  EKG  ED ECG REPORT I, Oswell Say,  Teena Iraniavid M, the attending physician, personally viewed and interpreted this ECG.   Date: 12/13/2015  EKG Time: 1546  Rate: 64  Rhythm: normal sinus rhythm  Axis: Normal  Intervals:left anterior fascicular block  ST&T Change: No ST elevation or depression. No  abnormal T-wave inversion.  ____________________________________________  RADIOLOGY    DG Abdomen Acute W/Chest (Final result)  Result time 12/13/15 15:50:09  Final result by Ulyses SouthwardMark Boles, MD (12/13/15 15:50:09)           Narrative:   CLINICAL DATA: Increased lethargy, rows, abdominal distension, dementia  EXAM: DG ABDOMEN ACUTE W/ 1V CHEST  COMPARISON: Chest radiograph 03/09/2015, CT abdomen and pelvis 03/09/2015  FINDINGS: Normal heart size, mediastinal contours, and pulmonary vascularity.  Chronic bronchitic and minimal chronic basilar interstitial changes.  No definite infiltrate, pleural effusion or pneumothorax.  Bones demineralized with degenerative changes spine.  Prior ORIF proximal LEFT humerus.  Nonobstructive bowel gas pattern.  No bowel dilatation or bowel wall thickening, or free air.  Scattered pelvic phleboliths.  Tiny nonobstructing calculus LEFT kidney.  IMPRESSION: Chronic bronchitic and interstitial changes.  Tiny nonobstructing LEFT renal calculus.  No acute abnormalities.   Electronically Signed By: Ulyses SouthwardMark Boles M.D. On: 12/13/2015 15:50  ____________________________________________   PROCEDURES  Procedure(s) performed:   Procedures  Critical Care performed:   ____________________________________________   INITIAL IMPRESSION / ASSESSMENT AND PLAN / ED COURSE  Pertinent labs & imaging results that were available during my care of the patient were reviewed by me and considered in my medical decision making (see chart for details).  ----------------------------------------- 6:26 PM on 12/13/2015 -----------------------------------------  Patient resting comfortably at this time. Still without any rash or distress. We'll give an albuterol treatment as well as steroids for several days and antibiotics. Otherwise, the workup is reassuring. Likely bronchitis with associated lethargy.  Clinical Course      ____________________________________________   FINAL CLINICAL IMPRESSION(S) / ED DIAGNOSES  Bronchitis. Weakness.    NEW MEDICATIONS STARTED DURING THIS VISIT:  New Prescriptions   No medications on file     Note:  This document was prepared using Dragon voice recognition software and may include unintentional dictation errors.    Myrna Blazeravid Matthew Myley Bahner, MD 12/13/15 [phone]

## 2015-12-13 NOTE — ED Notes (Signed)
Called [facility] to give report without able to get someone on line.

## 2016-01-10 ENCOUNTER — Emergency Department: Payer: Medicare Other

## 2016-01-10 ENCOUNTER — Encounter: Payer: Self-pay | Admitting: Emergency Medicine

## 2016-01-10 ENCOUNTER — Inpatient Hospital Stay
Admission: EM | Admit: 2016-01-10 | Discharge: 2016-01-14 | DRG: 640 | Disposition: A | Discharge status: Skilled Nursing Facility | Payer: Medicare Other | Attending: Internal Medicine | Admitting: Internal Medicine

## 2016-01-10 DIAGNOSIS — Z882 Allergy status to sulfonamides status: Secondary | ICD-10-CM | POA: Diagnosis not present

## 2016-01-10 DIAGNOSIS — E878 Other disorders of electrolyte and fluid balance, not elsewhere classified: Secondary | ICD-10-CM | POA: Diagnosis present

## 2016-01-10 DIAGNOSIS — Z7189 Other specified counseling: Secondary | ICD-10-CM

## 2016-01-10 DIAGNOSIS — K219 Gastro-esophageal reflux disease without esophagitis: Secondary | ICD-10-CM | POA: Diagnosis present

## 2016-01-10 DIAGNOSIS — I129 Hypertensive chronic kidney disease with stage 1 through stage 4 chronic kidney disease, or unspecified chronic kidney disease: Secondary | ICD-10-CM | POA: Diagnosis present

## 2016-01-10 DIAGNOSIS — N183 Chronic kidney disease, stage 3 (moderate): Secondary | ICD-10-CM | POA: Diagnosis present

## 2016-01-10 DIAGNOSIS — Z7982 Long term (current) use of aspirin: Secondary | ICD-10-CM | POA: Diagnosis not present

## 2016-01-10 DIAGNOSIS — Z881 Allergy status to other antibiotic agents status: Secondary | ICD-10-CM | POA: Diagnosis not present

## 2016-01-10 DIAGNOSIS — Z515 Encounter for palliative care: Secondary | ICD-10-CM | POA: Diagnosis not present

## 2016-01-10 DIAGNOSIS — R0682 Tachypnea, not elsewhere classified: Secondary | ICD-10-CM | POA: Diagnosis present

## 2016-01-10 DIAGNOSIS — K589 Irritable bowel syndrome without diarrhea: Secondary | ICD-10-CM | POA: Diagnosis present

## 2016-01-10 DIAGNOSIS — Z79899 Other long term (current) drug therapy: Secondary | ICD-10-CM

## 2016-01-10 DIAGNOSIS — E860 Dehydration: Secondary | ICD-10-CM | POA: Diagnosis not present

## 2016-01-10 DIAGNOSIS — Z7951 Long term (current) use of inhaled steroids: Secondary | ICD-10-CM | POA: Diagnosis not present

## 2016-01-10 DIAGNOSIS — B961 Klebsiella pneumoniae [K. pneumoniae] as the cause of diseases classified elsewhere: Secondary | ICD-10-CM | POA: Diagnosis present

## 2016-01-10 DIAGNOSIS — R059 Cough, unspecified: Secondary | ICD-10-CM

## 2016-01-10 DIAGNOSIS — G9341 Metabolic encephalopathy: Secondary | ICD-10-CM | POA: Diagnosis present

## 2016-01-10 DIAGNOSIS — N179 Acute kidney failure, unspecified: Secondary | ICD-10-CM | POA: Diagnosis present

## 2016-01-10 DIAGNOSIS — F0390 Unspecified dementia without behavioral disturbance: Secondary | ICD-10-CM | POA: Diagnosis present

## 2016-01-10 DIAGNOSIS — R52 Pain, unspecified: Secondary | ICD-10-CM

## 2016-01-10 DIAGNOSIS — M1990 Unspecified osteoarthritis, unspecified site: Secondary | ICD-10-CM | POA: Diagnosis present

## 2016-01-10 DIAGNOSIS — R1310 Dysphagia, unspecified: Secondary | ICD-10-CM | POA: Diagnosis present

## 2016-01-10 DIAGNOSIS — Z66 Do not resuscitate: Secondary | ICD-10-CM | POA: Diagnosis present

## 2016-01-10 DIAGNOSIS — N390 Urinary tract infection, site not specified: Secondary | ICD-10-CM | POA: Diagnosis present

## 2016-01-10 DIAGNOSIS — E870 Hyperosmolality and hypernatremia: Secondary | ICD-10-CM | POA: Diagnosis present

## 2016-01-10 DIAGNOSIS — E039 Hypothyroidism, unspecified: Secondary | ICD-10-CM | POA: Diagnosis present

## 2016-01-10 DIAGNOSIS — Z888 Allergy status to other drugs, medicaments and biological substances status: Secondary | ICD-10-CM

## 2016-01-10 DIAGNOSIS — F419 Anxiety disorder, unspecified: Secondary | ICD-10-CM | POA: Diagnosis present

## 2016-01-10 DIAGNOSIS — R4182 Altered mental status, unspecified: Secondary | ICD-10-CM | POA: Diagnosis not present

## 2016-01-10 DIAGNOSIS — E876 Hypokalemia: Secondary | ICD-10-CM | POA: Diagnosis present

## 2016-01-10 DIAGNOSIS — R05 Cough: Secondary | ICD-10-CM

## 2016-01-10 HISTORY — DX: Unspecified osteoarthritis, unspecified site: M19.90

## 2016-01-10 HISTORY — DX: Hypothyroidism, unspecified: E03.9

## 2016-01-10 HISTORY — DX: Essential (primary) hypertension: I10

## 2016-01-10 HISTORY — DX: Anxiety disorder, unspecified: F41.9

## 2016-01-10 HISTORY — DX: Irritable bowel syndrome, unspecified: K58.9

## 2016-01-10 HISTORY — DX: Gastro-esophageal reflux disease without esophagitis: K21.9

## 2016-01-10 HISTORY — DX: Migraine, unspecified, not intractable, without status migrainosus: G43.909

## 2016-01-10 LAB — CBC WITH DIFFERENTIAL/PLATELET
BAND NEUTROPHILS: 2 %
BASOS PCT: 1 %
BLASTS: 0 %
Basophils Absolute: 0.1 10*3/uL (ref 0–0.1)
EOS ABS: 0.1 10*3/uL (ref 0–0.7)
EOS PCT: 1 %
HEMATOCRIT: 42.6 % (ref 35.0–47.0)
HEMOGLOBIN: 13.7 g/dL (ref 12.0–16.0)
LYMPHS ABS: 4.5 10*3/uL — AB (ref 1.0–3.6)
LYMPHS PCT: 41 %
MCH: 29.1 pg (ref 26.0–34.0)
MCHC: 32.1 g/dL (ref 32.0–36.0)
MCV: 90.5 fL (ref 80.0–100.0)
METAMYELOCYTES PCT: 2 %
MONOS PCT: 8 %
Monocytes Absolute: 0.9 10*3/uL (ref 0.2–0.9)
Myelocytes: 0 %
NEUTROS ABS: 5.3 10*3/uL (ref 1.4–6.5)
Neutrophils Relative %: 45 %
OTHER: 0 %
Platelets: 128 10*3/uL — ABNORMAL LOW (ref 150–440)
Promyelocytes Absolute: 0 %
RBC: 4.7 MIL/uL (ref 3.80–5.20)
RDW: 16.4 % — AB (ref 11.5–14.5)
WBC: 10.9 10*3/uL (ref 3.6–11.0)
nRBC: 0 /100 WBC

## 2016-01-10 LAB — URINALYSIS COMPLETE WITH MICROSCOPIC (ARMC ONLY)
Bilirubin Urine: NEGATIVE
GLUCOSE, UA: NEGATIVE mg/dL
Ketones, ur: NEGATIVE mg/dL
Nitrite: POSITIVE — AB
Protein, ur: NEGATIVE mg/dL
Specific Gravity, Urine: 1.018 (ref 1.005–1.030)
pH: 5 (ref 5.0–8.0)

## 2016-01-10 LAB — COMPREHENSIVE METABOLIC PANEL
ALK PHOS: 65 U/L (ref 38–126)
ALT: 60 U/L — ABNORMAL HIGH (ref 14–54)
ANION GAP: 10 (ref 5–15)
AST: 44 U/L — ABNORMAL HIGH (ref 15–41)
Albumin: 4.1 g/dL (ref 3.5–5.0)
BILIRUBIN TOTAL: 0.7 mg/dL (ref 0.3–1.2)
BUN: 85 mg/dL — ABNORMAL HIGH (ref 6–20)
CALCIUM: 9.4 mg/dL (ref 8.9–10.3)
CO2: 29 mmol/L (ref 22–32)
Chloride: 126 mmol/L — ABNORMAL HIGH (ref 101–111)
Creatinine, Ser: 1.8 mg/dL — ABNORMAL HIGH (ref 0.44–1.00)
GFR calc non Af Amer: 25 mL/min — ABNORMAL LOW (ref >60)
GFR, EST AFRICAN AMERICAN: 28 mL/min — AB (ref >60)
Glucose, Bld: 116 mg/dL — ABNORMAL HIGH (ref 65–99)
POTASSIUM: 3.9 mmol/L (ref 3.5–5.1)
SODIUM: 165 mmol/L — AB (ref 135–145)
TOTAL PROTEIN: 8.3 g/dL — AB (ref 6.5–8.1)

## 2016-01-10 LAB — BLOOD GAS, VENOUS
ACID-BASE EXCESS: 7.3 mmol/L — AB (ref 0.0–2.0)
BICARBONATE: 33.6 mmol/L — AB (ref 20.0–28.0)
O2 Saturation: 83 %
PCO2 VEN: 53 mmHg (ref 44.0–60.0)
PH VEN: 7.41 (ref 7.250–7.430)
PO2 VEN: 47 mmHg — AB (ref 32.0–45.0)
Patient temperature: 37

## 2016-01-10 LAB — MRSA PCR SCREENING: MRSA by PCR: NEGATIVE

## 2016-01-10 LAB — TROPONIN I

## 2016-01-10 LAB — LACTIC ACID, PLASMA
LACTIC ACID, VENOUS: 1.6 mmol/L (ref 0.5–1.9)
Lactic Acid, Venous: 2.2 mmol/L (ref 0.5–1.9)

## 2016-01-10 MED ORDER — SODIUM CHLORIDE 0.9 % IV SOLN
1000.0000 mL | Freq: Once | INTRAVENOUS | Status: AC
  Administered 2016-01-10: 1000 mL via INTRAVENOUS

## 2016-01-10 MED ORDER — SODIUM CHLORIDE 0.9 % IV SOLN
3.0000 g | Freq: Two times a day (BID) | INTRAVENOUS | Status: DC
  Administered 2016-01-10 – 2016-01-13 (×7): 3 g via INTRAVENOUS
  Filled 2016-01-10 (×7): qty 3

## 2016-01-10 MED ORDER — ALBUTEROL SULFATE (2.5 MG/3ML) 0.083% IN NEBU: 2.5000 mg | INHALATION_SOLUTION | RESPIRATORY_TRACT | Status: DC | PRN

## 2016-01-10 MED ORDER — SODIUM CHLORIDE 0.9% FLUSH
3.0000 mL | Freq: Two times a day (BID) | INTRAVENOUS | Status: DC
  Administered 2016-01-13: 3 mL via INTRAVENOUS

## 2016-01-10 MED ORDER — ONDANSETRON HCL 4 MG/2ML IJ SOLN: 4.0000 mg | Freq: Four times a day (QID) | INTRAMUSCULAR | Status: DC | PRN

## 2016-01-10 MED ORDER — DEXTROSE 5 % IV SOLN
Freq: Once | INTRAVENOUS | Status: AC
  Administered 2016-01-10: 16:00:00 via INTRAVENOUS

## 2016-01-10 MED ORDER — ONDANSETRON HCL 4 MG PO TABS: 4.0000 mg | ORAL_TABLET | Freq: Four times a day (QID) | ORAL | Status: DC | PRN

## 2016-01-10 MED ORDER — ENOXAPARIN SODIUM 30 MG/0.3ML ~~LOC~~ SOLN
30.0000 mg | SUBCUTANEOUS | Status: DC
  Administered 2016-01-10 – 2016-01-13 (×4): 30 mg via SUBCUTANEOUS
  Filled 2016-01-10 (×4): qty 0.3

## 2016-01-10 MED ORDER — SODIUM CHLORIDE 0.45 % IV BOLUS
500.0000 mL | Freq: Once | INTRAVENOUS | Status: AC
  Administered 2016-01-10: 500 mL via INTRAVENOUS

## 2016-01-10 MED ORDER — ACETAMINOPHEN 325 MG PO TABS: 650.0000 mg | ORAL_TABLET | Freq: Four times a day (QID) | ORAL | Status: DC | PRN

## 2016-01-10 MED ORDER — ACETAMINOPHEN 650 MG RE SUPP
650.0000 mg | Freq: Four times a day (QID) | RECTAL | Status: DC | PRN
  Administered 2016-01-12 (×2): 650 mg via RECTAL
  Filled 2016-01-10 (×2): qty 1

## 2016-01-10 MED ORDER — OXYCODONE HCL 5 MG PO TABS
5.0000 mg | ORAL_TABLET | ORAL | Status: DC | PRN
  Administered 2016-01-13: 5 mg via ORAL
  Filled 2016-01-10: qty 1

## 2016-01-10 MED ORDER — BISACODYL 10 MG RE SUPP: 10.0000 mg | Freq: Every day | RECTAL | Status: DC | PRN

## 2016-01-10 NOTE — H&P (Signed)
[employer] - [place] at [facility]   PATIENT NAME: Regina Fields    MR#:  [id number]  DATE OF BIRTH:  December 29, 1928  DATE OF ADMISSION:  01/10/2016  PRIMARY CARE PHYSICIAN: No PCP Per Patient   REQUESTING/REFERRING PHYSICIAN: Dr. Mayford Knife  CHIEF COMPLAINT:   Chief Complaint  Patient presents with  . Altered Mental Status    HISTORY OF PRESENT ILLNESS:  Regina Fields  is a 82 y.o. female with a known history of Hypertension, dementia, irritable bowel syndrome who is a resident of local nursing home was brought to the emergency room after she was noticed to be more confused than normal. She does have baseline confusion due to dementia but has been close to non-verbal. Answers in one or 2 words occasionally. Agitated at times. Here in the emergency room blood work has shown patient to be severely hypernatremic with sodium of 165. Dehydration. Patient was on dysphagia 2 diet during her last discharge 6 months back.  PAST MEDICAL HISTORY:   Past Medical History:  Diagnosis Date  . Anxiety   . Dementia   . GERD (gastroesophageal reflux disease)   . Hypertension   . Hypothyroidism   . IBS (irritable bowel syndrome)   . Migraine   . Osteoarthritis     PAST SURGICAL HISTORY:   Past Surgical History:  Procedure Laterality Date  . TONSILLECTOMY      SOCIAL HISTORY:   Social History  Substance Use Topics  . Smoking status: Never Smoker  . Smokeless tobacco: Never Used  . Alcohol use Not on file    FAMILY HISTORY:  No family history on file. Unobtainable due to patient's dementia and altered mental status  DRUG ALLERGIES:   Allergies  Allergen Reactions  . Alendronate Shortness Of Breath  . Candesartan Shortness Of Breath  . Amoxicillin Diarrhea  . Atorvastatin     Other reaction(s): Muscle Pain  . Ceftriaxone     Other reaction(s): Muscle Pain  . Levofloxacin     Other reaction(s): Unknown  . Lisinopril     Other reaction(s): Unknown  .  Losartan     Other reaction(s): Unknown  . Nizatidine     Other reaction(s): Other (See Comments) Blurred vision  . Nsaids     Other reaction(s): Unknown  . Omeprazole Magnesium Nausea Only  . Psyllium     Other reaction(s): Unknown  . Sertaconazole     Other reaction(s): Unknown  . Sulfa Antibiotics Rash    REVIEW OF SYSTEMS:   ROS Cannot obtain review of systems due to dementia and acute encephalopathy MEDICATIONS AT HOME:   Prior to Admission medications   Medication Sig Start Date End Date Taking? Authorizing Provider  acetaminophen (TYLENOL) 325 MG tablet Take 650 mg by mouth 3 (three) times daily as needed for mild pain.    Yes Historical Provider, MD  albuterol (PROVENTIL HFA;VENTOLIN HFA) 108 (90 Base) MCG/ACT inhaler Inhale 2 puffs into the lungs every 6 (six) hours as needed for wheezing or shortness of breath. 12/13/15  Yes Myrna Blazer, MD  amLODipine (NORVASC) 5 MG tablet Take 5 mg by mouth daily.   Yes Historical Provider, MD  aspirin 81 MG chewable tablet Chew 81 mg by mouth daily.   Yes Historical Provider, MD  furosemide (LASIX) 40 MG tablet Take 40 mg by mouth daily.   Yes Historical Provider, MD  ipratropium-albuterol (DUONEB) 0.5-2.5 (3) MG/3ML SOLN Take 3 mLs by nebulization every 6 (six) hours. 03/13/15  Yes Lovina Zuver,  MD  levothyroxine (SYNTHROID, LEVOTHROID) 137 MCG tablet Take 137 mcg by mouth daily before breakfast.   Yes Historical Provider, MD  montelukast (SINGULAIR) 10 MG tablet Take 10 mg by mouth every evening.   Yes Historical Provider, MD  Multiple Vitamin (THEREMS) TABS Take 1 tablet by mouth daily.   Yes Historical Provider, MD  polyethylene glycol (MIRALAX / GLYCOLAX) packet Take 17 g by mouth at bedtime.   Yes Historical Provider, MD  traMADol (ULTRAM) 50 MG tablet Take 100 mg by mouth every 6 (six) hours as needed for severe pain.    Yes Historical Provider, MD     VITAL SIGNS:  Blood pressure (!) 117/59, resp. rate 17, height 5'  1" (1.549 m), weight 68.5 kg (151 lb 0.2 oz).  PHYSICAL EXAMINATION:  Physical Exam  GENERAL:  82 y.o.-year-old patient lying in the bed with no acute distress.  EYES: Pupils equal, round, reactive to light and accommodation. No scleral icterus. Extraocular muscles intact.  HEENT: Head atraumatic, normocephalic. Oropharynx and nasopharynx clear. No oropharyngeal erythema, moist oral mucosa  NECK:  Supple, no jugular venous distention. No thyroid enlargement, no tenderness.  LUNGS: Normal breath sounds bilaterally, no wheezing, rales, rhonchi. No use of accessory muscles of respiration.  CARDIOVASCULAR: S1, S2 normal. No murmurs, rubs, or gallops.  ABDOMEN: Soft, nontender, nondistended. Bowel sounds present. No organomegaly or mass.  EXTREMITIES: No pedal edema, cyanosis, or clubbing. + 2 pedal & radial pulses b/l.   NEUROLOGIC: Does not follow commands. PSYCHIATRIC: The patient is drowsy. Opens her eyes on calling her name.  LABORATORY PANEL:   CBC  Recent Labs Lab 01/10/16 1148  WBC 10.9  HGB 13.7  HCT 42.6  PLT 128*   ------------------------------------------------------------------------------------------------------------------  Chemistries   Recent Labs Lab 01/10/16 1148  NA 165*  K 3.9  CL 126*  CO2 29  GLUCOSE 116*  BUN 85*  CREATININE 1.80*  CALCIUM 9.4  AST 44*  ALT 60*  ALKPHOS 65  BILITOT 0.7   ------------------------------------------------------------------------------------------------------------------  Cardiac Enzymes  Recent Labs Lab 01/10/16 1148  TROPONINI <0.03   ------------------------------------------------------------------------------------------------------------------  RADIOLOGY:  Dg Chest 1 View  Result Date: 01/10/2016 CLINICAL DATA:  Dementia, hypertension, altered mental status EXAM: CHEST 1 VIEW COMPARISON:  12/13/2015 FINDINGS: Low lung volumes evident with chronic calcification of the tracheal bronchial structures. No  focal pneumonia, collapse or consolidation. Negative for edema, effusion or pneumothorax. Normal heart size. Aorta atherosclerotic. Degenerative changes of the spine. Chronic compression fracture of T12. Bones are osteopenic. Remote fixation of the left proximal humerus. IMPRESSION: Low volume exam without acute process. Thoracic aortic atherosclerosis Other chronic findings as above Electronically Signed   By: Judie PetitM.  Shick M.D.   On: 01/10/2016 12:15   Ct Head Wo Contrast  Result Date: 01/10/2016 CLINICAL DATA:  82 year old female with history of dementia. Altered mental status. EXAM: CT HEAD WITHOUT CONTRAST TECHNIQUE: Contiguous axial images were obtained from the base of the skull through the vertex without intravenous contrast. COMPARISON:  No priors. FINDINGS: Brain: Severe cerebral and mild cerebellar atrophy. Ex vacuo dilatation of the ventricular system. Patchy and confluent areas of decreased attenuation are noted throughout the deep and periventricular white matter of the cerebral hemispheres bilaterally, compatible with chronic microvascular ischemic disease. Physiologic calcifications of the basal ganglia bilaterally. No definite evidence of acute infarction, hemorrhage, hydrocephalus, extra-axial collection or mass lesion/mass effect. Vascular: No hyperdense vessel or unexpected calcification. Skull: Normal. Negative for fracture or focal lesion. Sinuses/Orbits: Minimal mucosal thickening in the posterior aspect  of the right maxillary sinus. No acute finding. Other: None. IMPRESSION: 1. No acute intracranial abnormalities. 2. Severe cerebral and mild cerebellar atrophy with extensive chronic microvascular ischemic changes throughout the cerebral white matter, as above. Electronically Signed   By: Trudie Reedaniel  Entrikin M.D.   On: 01/10/2016 11:56     IMPRESSION AND PLAN:   * Acute hypernatremia due to severe dehydration This is most likely due to decreased oral intake with patient's history of  dysphagia.  We will bolus half-normal saline. Started on D5W at 100 ML per hour. Repeat sodium labs. Patient will be nothing by mouth at this time. Speech evaluation. Once more awake patient's dysphagia nutritional intake need to be assessed. May need discussion with family regarding PEG tube if patient unable to meet her nutritional needs. At this could be a recurrent problem.  * UTI Start meropenem due to allergy profile Wait for urine cultures.  * Dementia with acute encephalopathy due to acute abnormality Should improve once dehydration is corrected. Watch for inpatient delirium.  * Hypertension We will hold blood pressure medications.  * Acute kidney injury over CKD stage III due to dehydration. On IV fluids. Monitor input and output. Repeat labs in the morning.  * DVT prophylaxis with Lovenox.  All the records are reviewed and case discussed with ED provider. Management plans discussed with the patient, family and they are in agreement.  CODE STATUS: FULL CODE  TOTAL CC TIME TAKING CARE OF THIS PATIENT: 40 minutes.   Milagros LollSudini, Hans Rusher R M.D on 01/10/2016 at 1:26 PM  Between 7am to 6pm - Pager - [phone]  After 6pm go to [email or web address] - password [email or web address] [facility]  [facility] [place] [facility]  Office  [phone]  CC: Primary care physician; No PCP Per Patient  Note: This dictation was prepared with Dragon dictation along with smaller phrase technology. Any transcriptional errors that result from this process are unintentional.

## 2016-01-10 NOTE — ED Triage Notes (Signed)
BIB EMS from [facility] for altered mental status. Staff reports this morning pt has been moaning, with blank stare and repeats "Oh God" Hx of dementia and is normally able to answer yes no questions but does not has this time. Vitals stable en route per EMS CBG 159

## 2016-01-10 NOTE — Progress Notes (Signed)
Pharmacy Antibiotic Note  Regina Fields is a 82 y.o. female admitted on 01/10/2016 with Hypernatremia and UTI .  Pharmacy has been consulted for Unasyn dosing. Patient has listed reaction of muscle pain to Ceftriaxone. Dr. Elpidio AnisSudini does not want to try other cephalosporins because there are no family members to talk to about severity of muscle pain and [facility].  Patient has listed reaction of diarrhea to amoxicillin, which is not true allergy.   Plan: Will start patient on Unasyn 3gm IV every 12 hours based on CrCl <30 ml/min. Will  Continue to follow renal function and culture results and adjust Abx accordingly.   Height: 5\' 1"  (154.9 cm) Weight: 151 lb 0.2 oz (68.5 kg) IBW/kg (Calculated) : 47.8  No data recorded.   Recent Labs Lab 01/10/16 1148  WBC 10.9  CREATININE 1.80*  LATICACIDVEN 2.2*    Estimated Creatinine Clearance: 20.2 mL/min (by C-G formula based on SCr of 1.8 mg/dL (H)).    Allergies  Allergen Reactions  . Alendronate Shortness Of Breath  . Candesartan Shortness Of Breath  . Amoxicillin Diarrhea  . Atorvastatin     Other reaction(s): Muscle Pain  . Ceftriaxone     Other reaction(s): Muscle Pain  . Levofloxacin     Other reaction(s): Unknown  . Lisinopril     Other reaction(s): Unknown  . Losartan     Other reaction(s): Unknown  . Nizatidine     Other reaction(s): Other (See Comments) Blurred vision  . Nsaids     Other reaction(s): Unknown  . Omeprazole Magnesium Nausea Only  . Psyllium     Other reaction(s): Unknown  . Sertaconazole     Other reaction(s): Unknown  . Sulfa Antibiotics Rash    Antimicrobials this admission: 12/02 Unasyn  >>   Dose adjustments this admission:   Microbiology results: 12/2 UCx: sent  Thank you for allowing pharmacy to be a part of this patient's care.  Gardner CandleSheema M Delbert Darley, PharmD, BCPS Clinical Pharmacist 01/10/2016 2:39 PM

## 2016-01-10 NOTE — ED Provider Notes (Addendum)
[facility] Emergency Department Provider Note     L5 caveat: Review of systems and history is limited by altered mental status and dementia   Time seen: ----------------------------------------- 11:00 AM on 01/10/2016 -----------------------------------------    I have reviewed the triage vital signs and the nursing notes.   HISTORY  Chief Complaint No chief complaint on file.    HPI Regina Fields is a 82 y.o. female brought to the ER from a nursing home for altered mental status. According to reports she can only answer yes or no questions due to her dementia but she appears more altered than normal. Patient apparently just staring out into space and was reportedly unresponsive. On arrival she screams out in pain whenever stimulated.   Past Medical History:  Diagnosis Date  . Dementia     Patient Active Problem List   Diagnosis Date Noted  . Healthcare-associated pneumonia 03/09/2015  . HTN (hypertension) 03/09/2015  . Dementia 03/09/2015  . Dyslipidemia 03/09/2015  . HCAP (healthcare-associated pneumonia) 03/09/2015  . Acute respiratory failure (HCC) 03/01/2015    Past Surgical History:  Procedure Laterality Date  . TONSILLECTOMY      Allergies Alendronate; Candesartan; Amoxicillin; Atorvastatin; Ceftriaxone; Levofloxacin; Lisinopril; Losartan; Nizatidine; Nsaids; Omeprazole magnesium; Psyllium; Sertaconazole; and Sulfa antibiotics  Social History Social History  Substance Use Topics  . Smoking status: Never Smoker  . Smokeless tobacco: Never Used  . Alcohol use Not on file    Review of Systems Unable to assess at this time  ____________________________________________   PHYSICAL EXAM:  VITAL SIGNS: ED Triage Vitals  Enc Vitals Group     BP      Pulse      Resp      Temp      Temp src      SpO2      Weight      Height      Head Circumference      Peak Flow      Pain Score      Pain Loc      Pain Edu?      Excl.  in GC?     Constitutional: Alert But disoriented, no obvious distress Eyes: Conjunctivae are normal. PERRL. Normal extraocular movements. ENT   Head: Normocephalic and atraumatic.   Nose: No congestion/rhinnorhea.   Mouth/Throat: Mucous membranes are dry   Neck: No stridor. Cardiovascular: Normal rate, regular rhythm. No murmurs, rubs, or gallops. Respiratory: Normal respiratory effort without tachypnea nor retractions. Breath sounds are clear and equal bilaterally. No wheezes/rales/rhonchi. Gastrointestinal: Soft and nontender. Normal bowel sounds Musculoskeletal: Nontender with normal range of motion in all extremities. No lower extremity tenderness nor edema. Neurologic:  Patient responds to painful stimuli, will not cooperate with examination. Skin:  Skin is warm, dry and intact. No rash noted. ____________________________________________  ED COURSE:  Pertinent labs & imaging results that were available during my care of the patient were reviewed by me and considered in my medical decision making (see chart for details). Clinical Course   Patient presented to the ER with altered mental status although her baseline as are the altered. We'll assess with labs and imaging.  Procedures  EKG: A by me, sinus rhythm with a rate of 80 bpm, normal PR interval, wide QRS, normal QT, right bundle branch block, left anterior fascicular block ____________________________________________   LABS (pertinent positives/negatives)  Labs Reviewed  CBC WITH DIFFERENTIAL/PLATELET - Abnormal; Notable for the following:       Result Value  RDW 16.4 (*)    Platelets 128 (*)    All other components within normal limits  COMPREHENSIVE METABOLIC PANEL - Abnormal; Notable for the following:    Sodium 165 (*)    Chloride 126 (*)    Glucose, Bld 116 (*)    BUN 85 (*)    Creatinine, Ser 1.80 (*)    Total Protein 8.3 (*)    AST 44 (*)    ALT 60 (*)    GFR calc non Af Amer 25 (*)    GFR  calc Af Amer 28 (*)    All other components within normal limits  LACTIC ACID, PLASMA - Abnormal; Notable for the following:    Lactic Acid, Venous 2.2 (*)    All other components within normal limits  BLOOD GAS, VENOUS - Abnormal; Notable for the following:    pO2, Ven 47.0 (*)    Bicarbonate 33.6 (*)    Acid-Base Excess 7.3 (*)    All other components within normal limits  TROPONIN I  LACTIC ACID, PLASMA  URINALYSIS COMPLETEWITH MICROSCOPIC ([facility] ONLY)  CBG MONITORING, ED  CRITICAL CARE Performed by: Emily FilbertWilliams, Haralambos Yeatts E   Total critical care time: 30 minutes  Critical care time was exclusive of separately billable procedures and treating other patients.  Critical care was necessary to treat or prevent imminent or life-threatening deterioration.  Critical care was time spent personally by me on the following activities: development of treatment plan with patient and/or surrogate as well as nursing, discussions with consultants, evaluation of patient's response to treatment, examination of patient, obtaining history from patient or surrogate, ordering and performing treatments and interventions, ordering and review of laboratory studies, ordering and review of radiographic studies, pulse oximetry and re-evaluation of patient's condition.   RADIOLOGY  Chest x-ray, CT head Did not reveal any acute process ____________________________________________  FINAL ASSESSMENT AND PLAN  Altered mental status, dehydration, hypernatremia, hyperchloremia  Plan: Patient with labs and imaging as dictated above. Patient presented to the ER with altered mental status which is likely due to dehydration. We have started fluids but will change her to D5W at an appropriate rate. I will discuss with the hospitalist for admission.   Emily FilbertWilliams, Jaegar Croft E, MD   Note: This dictation was prepared with Dragon dictation. Any transcriptional errors that result from this process are unintentional     Emily FilbertJonathan E Sharesa Kemp, MD 01/10/16 1252    Emily FilbertJonathan E Tyrick Dunagan, MD 01/10/16 [phone]

## 2016-01-11 LAB — CBC
HEMATOCRIT: 36.6 % (ref 35.0–47.0)
HEMOGLOBIN: 12.2 g/dL (ref 12.0–16.0)
MCH: 30.2 pg (ref 26.0–34.0)
MCHC: 33.3 g/dL (ref 32.0–36.0)
MCV: 90.6 fL (ref 80.0–100.0)
Platelets: 106 10*3/uL — ABNORMAL LOW (ref 150–440)
RBC: 4.04 MIL/uL (ref 3.80–5.20)
RDW: 15.9 % — AB (ref 11.5–14.5)
WBC: 8 10*3/uL (ref 3.6–11.0)

## 2016-01-11 LAB — COMPREHENSIVE METABOLIC PANEL
ALBUMIN: 3.5 g/dL (ref 3.5–5.0)
ALK PHOS: 56 U/L (ref 38–126)
ALT: 50 U/L (ref 14–54)
ANION GAP: 8 (ref 5–15)
AST: 38 U/L (ref 15–41)
BUN: 66 mg/dL — AB (ref 6–20)
CALCIUM: 8.7 mg/dL — AB (ref 8.9–10.3)
CO2: 27 mmol/L (ref 22–32)
Chloride: 123 mmol/L — ABNORMAL HIGH (ref 101–111)
Creatinine, Ser: 1.43 mg/dL — ABNORMAL HIGH (ref 0.44–1.00)
GFR calc Af Amer: 38 mL/min — ABNORMAL LOW (ref >60)
GFR calc non Af Amer: 32 mL/min — ABNORMAL LOW (ref >60)
GLUCOSE: 110 mg/dL — AB (ref 65–99)
Potassium: 3.4 mmol/L — ABNORMAL LOW (ref 3.5–5.1)
SODIUM: 158 mmol/L — AB (ref 135–145)
Total Bilirubin: 1.1 mg/dL (ref 0.3–1.2)
Total Protein: 6.9 g/dL (ref 6.5–8.1)

## 2016-01-11 MED ORDER — DEXTROSE 5 % IV SOLN
INTRAVENOUS | Status: DC
  Administered 2016-01-11: 09:00:00 via INTRAVENOUS

## 2016-01-11 MED ORDER — DEXTROSE 5 % IV SOLN: Freq: Once | INTRAVENOUS | Status: DC

## 2016-01-11 MED ORDER — POTASSIUM CL IN DEXTROSE 5% 20 MEQ/L IV SOLN
20.0000 meq | INTRAVENOUS | Status: DC
  Administered 2016-01-11 – 2016-01-13 (×4): 20 meq via INTRAVENOUS
  Filled 2016-01-11 (×6): qty 1000

## 2016-01-11 NOTE — Care Management Important Message (Signed)
Important Message  Patient Details  Name: Regina Fields MRN: [id number] Date of Birth: February 09, 1928   Medicare Important Message Given:  Yes    Kimberlly Norgard A, RN 01/11/2016, 4:21 PM

## 2016-01-11 NOTE — Clinical Social Work Note (Signed)
Clinical Social Work Assessment  Patient Details  Name: Regina Fields MRN: [id number] Date of Birth: 08-21-1928  Date of referral:  01/11/16               Reason for consult:  Facility Placement                Permission sought to share information with:  Facility Industrial/product designerContact Representative Permission granted to share information::     Name::        Agency::     Relationship::     Contact Information:     Housing/Transportation Living arrangements for the past 2 months:  Skilled Building surveyorursing Facility Source of Information:  Facility Patient Interpreter Needed:  None Criminal Activity/Legal Involvement Pertinent to Current Situation/Hospitalization:  No - Comment as needed Significant Relationships:  Adult Children Lives with:  Facility Resident Do you feel safe going back to the place where you live?  Yes Need for family participation in patient care:  Yes (Comment)  Care giving concerns:  [facility]   Social Worker assessment / plan:  The CSW attempted to contact family to discuss the patient's care. The phone # for Regina Fields is disconnected. The phone numbers for Regina Fields were unanswered and the voicemail system was "full". The [facility] to confirm that the patient is indeed a LTC resident. The nurse on duty confirmed this and confirmed that the patient can return when she is medically stable.   Employment status:  Retired Health and safety inspectornsurance information:  Medicare PT Recommendations:  Skilled Nursing Facility Information / Referral to community resources:     Patient/Family's Response to care:  The patient has dementia and is unresponsive at this time.  Patient/Family's Understanding of and Emotional Response to Diagnosis, Current Treatment, and Prognosis:  The patient has dementia and is unresponsive at this time.  Emotional Assessment Appearance:  Appears stated age Attitude/Demeanor/Rapport:  Unresponsive Affect (typically observed):   Agitated Orientation:   (The patient has dementia exacerbated by AMS from UTI.) Alcohol / Substance use:  Never Used Psych involvement (Current and /or in the community):  No (Comment)  Discharge Needs  Concerns to be addressed:  Care Coordination Readmission within the last 30 days:  No Current discharge risk:  Chronically ill Barriers to Discharge:  Continued Medical Work up   [employer] CorporationKaren M Cassius Cullinane, LCSW 01/11/2016, 3:05 PM

## 2016-01-11 NOTE — Plan of Care (Signed)
Problem: Nutrition: Goal: Adequate nutrition will be maintained Outcome: Not Progressing NPO    

## 2016-01-11 NOTE — Progress Notes (Addendum)
[facility] - [place] at [facility]   PATIENT NAME: Regina Fields    MR#:  [id number]  DATE OF BIRTH:  [birth year]  SUBJECTIVE:  CHIEF COMPLAINT:   Chief Complaint  Patient presents with  . Altered Mental Status   The patient is confused and demented, noncommunicative. REVIEW OF SYSTEMS:  Review of Systems  Unable to perform ROS: Dementia    DRUG ALLERGIES:   Allergies  Allergen Reactions  . Alendronate Shortness Of Breath  . Candesartan Shortness Of Breath  . Amoxicillin Diarrhea  . Atorvastatin     Other reaction(s): Muscle Pain  . Ceftriaxone     Other reaction(s): Muscle Pain  . Levofloxacin     Other reaction(s): Unknown  . Lisinopril     Other reaction(s): Unknown  . Losartan     Other reaction(s): Unknown  . Nizatidine     Other reaction(s): Other (See Comments) Blurred vision  . Nsaids     Other reaction(s): Unknown  . Omeprazole Magnesium Nausea Only  . Psyllium     Other reaction(s): Unknown  . Sertaconazole     Other reaction(s): Unknown  . Sulfa Antibiotics Rash   VITALS:  Blood pressure (!) 125/58, pulse 69, temperature 97.9 F (36.6 C), resp. rate 18, height 5\' 1"  (1.549 m), weight 145 lb 4.8 oz (65.9 kg), SpO2 95 %. PHYSICAL EXAMINATION:  Physical Exam  Constitutional: She is well-developed, well-nourished, and in no distress.  HENT:  Head: Normocephalic.  Mouth/Throat: Oropharynx is clear and moist.  Eyes: Conjunctivae and EOM are normal.  Neck: Normal range of motion. Neck supple. No JVD present. No tracheal deviation present.  Cardiovascular: Normal rate, regular rhythm and normal heart sounds.  Exam reveals no gallop.   No murmur heard. Pulmonary/Chest: Effort normal and breath sounds normal. No respiratory distress. She has no wheezes. She has no rales.  Abdominal: Soft. Bowel sounds are normal. She exhibits no distension. There is no tenderness.  Musculoskeletal: She exhibits no edema or tenderness.  Neurological:   Unable to exam due to dementia and confusion.  Skin: No rash noted. No erythema.  Psychiatric:  Demented and confused.   LABORATORY PANEL:   CBC  Recent Labs Lab 01/11/16 0623  WBC 8.0  HGB 12.2  HCT 36.6  PLT 106*   ------------------------------------------------------------------------------------------------------------------ Chemistries   Recent Labs Lab 01/11/16 0623  NA 158*  K 3.4*  CL 123*  CO2 27  GLUCOSE 110*  BUN 66*  CREATININE 1.43*  CALCIUM 8.7*  AST 38  ALT 50  ALKPHOS 56  BILITOT 1.1   RADIOLOGY:  No results found. ASSESSMENT AND PLAN:   * Acute hypernatremia due to severe dehydration Continue D5W, follow-up sodium level.  * Acute kidney injury over CKD stage III due to dehydration. Continue IV fluid support and follow-up BMP.  * Poor oral intake.  nothing by mouth and speech evaluation. Dietitian consult after speech study.  * UTI Continue Unasyn. Follow-up urine cultures.  * Dementia with acute mental encephalopathy due to above. Aspiration and fall precaution.  * Hypertension. Blood pressure is in low side, hold blood pressure medications.  * Hypokalemia. Give IV fluid support with potassium, follow-up BMP.  Poorr prognosis, palliative care consult. All the records are reviewed and case discussed with Care Management/Social Worker. Management plans discussed with the patient's son and they are in agreement.  CODE STATUS: DO NOT RESUSCITATE  TOTAL TIME TAKING CARE OF THIS PATIENT: 37 minutes.   More than 50% of the  time was spent in counseling/coordination of care: YES  POSSIBLE D/C IN 3 DAYS, DEPENDING ON CLINICAL CONDITION.   Shaune Pollackhen, Whitt Auletta M.D on 01/11/2016 at 2:47 PM  Between 7am to 6pm - Pager - [phone]  After 6pm go to [email or web address] - Scientist, research (life sciences)password [email or web address]  [facility] [place] [facility]  Office  [phone]  CC: Primary care physician; No PCP Per Patient  Note: This dictation was prepared with  Dragon dictation along with smaller phrase technology. Any transcriptional errors that result from this process are unintentional.

## 2016-01-11 NOTE — Progress Notes (Signed)
Advanced Care Plan.  Purpose of Encounter: CODE STATUS Parties in Attendance: The patient's son, nurse and me. Patient's Decisional Capacity: No.  Subjective/Patient Story: Regina Fields  is a 82 y.o. female with a known history of Hypertension, dementia, irritable bowel syndrome who is a resident of local nursing home was brought to the emergency room after she was noticed to be more confused than normal.   Objective/Medical Story: She is demented and non-communicative. She has UTI, acute hypernatremia due to severe dehydration, acute kidney injury over CKD stage III and poor oral intake. I called her son (POA) at patient's bedside, updated the patient condition, treatment plan and poor prognosis. Her [place].  I discussed with him about CPR/Intubation. The patient is FULL CODE on admission. Her son decided to put the patient on DO NOT RESUSCITATE status after discussion. RN also talked with her son after me and made sure the patient CODE STATUS is changed to DO NOT RESUSCITATE.  Goals of Care Determinations: DO NOT RESUSCITATE.  Plan:  Code Status: DO NOT RESUSCITATE.  Time spent discussing advance care planning: 25 minutes.

## 2016-01-12 ENCOUNTER — Inpatient Hospital Stay: Payer: Medicare Other

## 2016-01-12 LAB — BASIC METABOLIC PANEL
ANION GAP: 6 (ref 5–15)
BUN: 50 mg/dL — ABNORMAL HIGH (ref 6–20)
CALCIUM: 8.8 mg/dL — AB (ref 8.9–10.3)
CO2: 27 mmol/L (ref 22–32)
Chloride: 122 mmol/L — ABNORMAL HIGH (ref 101–111)
Creatinine, Ser: 1.4 mg/dL — ABNORMAL HIGH (ref 0.44–1.00)
GFR, EST AFRICAN AMERICAN: 39 mL/min — AB (ref >60)
GFR, EST NON AFRICAN AMERICAN: 33 mL/min — AB (ref >60)
Glucose, Bld: 148 mg/dL — ABNORMAL HIGH (ref 65–99)
POTASSIUM: 3.5 mmol/L (ref 3.5–5.1)
Sodium: 155 mmol/L — ABNORMAL HIGH (ref 135–145)

## 2016-01-12 LAB — MAGNESIUM: Magnesium: 2.5 mg/dL — ABNORMAL HIGH (ref 1.7–2.4)

## 2016-01-12 MED ORDER — CHLORHEXIDINE GLUCONATE 0.12 % MT SOLN
15.0000 mL | Freq: Two times a day (BID) | OROMUCOSAL | Status: DC
  Administered 2016-01-12 – 2016-01-14 (×3): 15 mL via OROMUCOSAL
  Filled 2016-01-12 (×2): qty 15

## 2016-01-12 MED ORDER — ORAL CARE MOUTH RINSE
15.0000 mL | Freq: Two times a day (BID) | OROMUCOSAL | Status: DC
  Administered 2016-01-12 – 2016-01-14 (×3): 15 mL via OROMUCOSAL

## 2016-01-12 MED ORDER — LEVOTHYROXINE SODIUM 137 MCG PO TABS
137.0000 ug | ORAL_TABLET | Freq: Every day | ORAL | Status: DC
  Administered 2016-01-13 – 2016-01-14 (×2): 137 ug via ORAL
  Filled 2016-01-12 (×2): qty 1

## 2016-01-12 NOTE — Progress Notes (Signed)
[facility] -  at [facility]   PATIENT NAME: Regina Fields    MR#:  [id number]  DATE OF BIRTH:  [birth year]  SUBJECTIVE:  CHIEF COMPLAINT:   Chief Complaint  Patient presents with  . Altered Mental Status   The patient is confused and demented, noncommunicative. REVIEW OF SYSTEMS:  Review of Systems  Unable to perform ROS: Dementia    DRUG ALLERGIES:   Allergies  Allergen Reactions  . Alendronate Shortness Of Breath  . Candesartan Shortness Of Breath  . Amoxicillin Diarrhea  . Atorvastatin     Other reaction(s): Muscle Pain  . Ceftriaxone     Other reaction(s): Muscle Pain  . Levofloxacin     Other reaction(s): Unknown  . Lisinopril     Other reaction(s): Unknown  . Losartan     Other reaction(s): Unknown  . Nizatidine     Other reaction(s): Other (See Comments) Blurred vision  . Nsaids     Other reaction(s): Unknown  . Omeprazole Magnesium Nausea Only  . Psyllium     Other reaction(s): Unknown  . Sertaconazole     Other reaction(s): Unknown  . Sulfa Antibiotics Rash   VITALS:  Blood pressure (!) 118/51, pulse 73, temperature 99.1 F (37.3 C), temperature source Oral, resp. rate 18, height 5\' 1"  (1.549 m), weight 147 lb (66.7 kg), SpO2 95 %. PHYSICAL EXAMINATION:  Physical Exam  Constitutional: She is well-developed, well-nourished, and in no distress.  HENT:  Head: Normocephalic.  Mouth/Throat: Oropharynx is clear and moist.  Eyes: Conjunctivae and EOM are normal.  Neck: Normal range of motion. Neck supple. No JVD present. No tracheal deviation present.  Cardiovascular: Normal rate, regular rhythm and normal heart sounds.  Exam reveals no gallop.   No murmur heard. Pulmonary/Chest: Effort normal and breath sounds normal. No respiratory distress. She has no wheezes. She has no rales.  Abdominal: Soft. Bowel sounds are normal. She exhibits no distension. There is no tenderness.  Musculoskeletal: She exhibits no edema or  tenderness.  Neurological:  Unable to exam due to dementia and confusion.  Skin: No rash noted. No erythema.  Psychiatric:  Demented and confused.   LABORATORY PANEL:   CBC  Recent Labs Lab 01/11/16 0623  WBC 8.0  HGB 12.2  HCT 36.6  PLT 106*   ------------------------------------------------------------------------------------------------------------------ Chemistries   Recent Labs Lab 01/11/16 0623 01/12/16 0447  NA 158* 155*  K 3.4* 3.5  CL 123* 122*  CO2 27 27  GLUCOSE 110* 148*  BUN 66* 50*  CREATININE 1.43* 1.40*  CALCIUM 8.7* 8.8*  MG  --  2.5*  AST 38  --   ALT 50  --   ALKPHOS 56  --   BILITOT 1.1  --    RADIOLOGY:  Dg Hand Complete Right  Result Date: 01/12/2016 CLINICAL DATA:  Pain with movement of thumb.  No known trauma. EXAM: RIGHT HAND - COMPLETE 3+ VIEW COMPARISON:  None. FINDINGS: Negative for acute fracture, dislocation or radiopaque foreign body. Moderately severe arthritic changes are present at the first carpometacarpal joint. Moderate arthritic changes are present at the first MCP joint and at the first interphalangeal joint. There also are severe arthritic changes at the PIP joints. There is prior surgical resection at the third PIP joint. There is ankylosis at the fifth PIP joint. No radiopaque foreign body. IMPRESSION: Significant arthritic changes of the first digit and also of all PIP joints. Negative for acute fracture, dislocation or radiopaque foreign body. Electronically Signed  By: Ellery Plunkaniel R Mitchell M.D.   On: 01/12/2016 01:23   ASSESSMENT AND PLAN:   * Acute hypernatremia due to severe dehydration, a little better. Continue D5W, follow-up sodium level.  * Acute kidney injury over CKD stage III due to dehydration. Continue IV fluid support and follow-up BMP. Improving.  * Poor oral intake.  Per Speech study , dysphagia 1 w/ Honey consistency liquids; strict aspiration precautions; feeding support at all meals  * UTI Continue  Unasyn. Follow-up urine cultures: KLEBSIELLA PNEUMONIAE.  * Dementia with acute mental encephalopathy due to above. Aspiration and fall precaution.  * Hypertension. Blood pressure is in low side, hold blood pressure medications.  * Hypokalemia. Give IV fluid support with potassium, improved.  Poorr prognosis, palliative care consult. All the records are reviewed and case discussed with Care Management/Social Worker. Management plans discussed with the patient's son and they are in agreement.  CODE STATUS: DO NOT RESUSCITATE  TOTAL TIME TAKING CARE OF THIS PATIENT: 36 minutes.   More than 50% of the time was spent in counseling/coordination of care: YES  POSSIBLE D/C IN 3 DAYS, DEPENDING ON CLINICAL CONDITION.   Shaune Pollackhen, Ariela Mochizuki M.D on 01/12/2016 at 2:10 PM  Between 7am to 6pm - Pager - [phone]  After 6pm go to [email or web address] - Scientist, research (life sciences)password [email or web address]  [facility]  Office  [phone]  CC: Primary care physician; No PCP Per Patient  Note: This dictation was prepared with Dragon dictation along with smaller phrase technology. Any transcriptional errors that result from this process are unintentional.

## 2016-01-12 NOTE — Evaluation (Addendum)
Clinical/Bedside Swallow Evaluation Patient Details  Name: Regina Fields MRN: [id number] Date of Birth: [birth year]  Today's Date: 01/12/2016 Time: SLP Start Time (ACUTE ONLY): 1115 SLP Stop Time (ACUTE ONLY): 1215 SLP Time Calculation (min) (ACUTE ONLY): 60 min  Past Medical History:  Past Medical History:  Diagnosis Date  . Anxiety   . Dementia   . GERD (gastroesophageal reflux disease)   . Hypertension   . Hypothyroidism   . IBS (irritable bowel syndrome)   . Migraine   . Osteoarthritis    Past Surgical History:  Past Surgical History:  Procedure Laterality Date  . TONSILLECTOMY     HPI:     is a 82 y.o. female with a known history of Hypertension, dementia, irritable bowel syndrome who is a resident of local nursing home was brought to the emergency room after she was noticed to be more confused than normal. She does have baseline confusion due to dementia but has been close to non-verbal. Answers in one or 2 words occasionally. Agitated at times. Here in the emergency room blood work has shown patient to be severely hypernatremic with sodium of 165. Dehydration. Patient was on dysphagia 2 diet during her last discharge 6 months back.   Assessment / Plan / Recommendation Clinical Impression  Pt appears at increased risk for aspiration moreso w/ liquids w/ coughing and/or throat clearing noted post trials of ice chips and Nectar liquid and suspected delay of swallowing secondary to audible swallows noted during trials. Pt appeared to adquately tolerate trials of Honey consistency liquids w/ no immediate, overt s/s of aspiration noted; similar presentation w/ puree trials. Oral phase c/b min deficits for bolus management and oral clearing but given time was able to clear. Pt required feeding support w/ all trials. Due to pt's increased risk for dysphagia w/ aspiration and sequelae of such including Pneumonia, pt would benefit from a modified dsyphagia diet of Dysphagia 1 w/ Honey  liquids w/ strict aspiration precautions; meds in Puree. ST will f/u w/ trials to upgrade and/or objective swallow assessment as indicated.     Aspiration Risk  Moderate aspiration risk    Diet Recommendation  Dysphagia 1 w/ Honey consistency liquids; strict aspiration precautions; feeding support at all meals.   Medication Administration: Crushed with puree (as able)    Other  Recommendations Recommended Consults:  (Dietician consult) Oral Care Recommendations: Oral care BID;Staff/trained caregiver to provide oral care Other Recommendations: Order thickener from pharmacy;Prohibited food (jello, ice cream, thin soups);Remove water pitcher;Have oral suction available   Follow up Recommendations Skilled Nursing facility (TBD)      Frequency and Duration min 3x week  2 weeks       Prognosis Prognosis for Safe Diet Advancement: Guarded Barriers to Reach Goals: Cognitive deficits;Severity of deficits      Swallow Study   General Date of Onset: 01/10/16 Type of Study: Bedside Swallow Evaluation Previous Swallow Assessment: none indicated recently Diet Prior to this Study:  (unsure; unknown) Temperature Spikes Noted: No (wbc 8.0) Respiratory Status: Room air History of Recent Intubation: No Behavior/Cognition: Alert;Cooperative;Confused;Pleasant mood;Distractible;Requires cueing Oral Cavity Assessment: Dry;Dried secretions Oral Care Completed by SLP: Yes Oral Cavity - Dentition: Adequate natural dentition;Missing dentition (few) Vision:  (n/a) Self-Feeding Abilities: Total assist Patient Positioning: Upright in bed Baseline Vocal Quality: Low vocal intensity Volitional Cough: Cognitively unable to elicit Volitional Swallow: Unable to elicit    Oral/Motor/Sensory Function Overall Oral Motor/Sensory Function:  (overall reduced ROM - weak in general)   [employer] [place] Chips Ice  chips: Impaired Presentation: Spoon (fed; 2 trials) Oral Phase Impairments: Reduced labial seal;Reduced lingual  movement/coordination Oral Phase Functional Implications: Prolonged oral transit Pharyngeal Phase Impairments: Throat Clearing - Delayed;Suspected delayed Swallow (x1)   Thin Liquid Thin Liquid: Not tested    Nectar Thick Nectar Thick Liquid: Impaired Presentation: Spoon (fed; 5 trials) Oral Phase Impairments: Reduced labial seal;Reduced lingual movement/coordination Oral phase functional implications: Prolonged oral transit Pharyngeal Phase Impairments: Cough - Delayed;Throat Clearing - Delayed;Suspected delayed Swallow (x1 each)   Honey Thick Honey Thick Liquid: Impaired Presentation: Spoon (fed; ~2 ozs) Oral Phase Impairments: Reduced labial seal;Reduced lingual movement/coordination Oral Phase Functional Implications: Prolonged oral transit Pharyngeal Phase Impairments: Suspected delayed Swallow (none)   Puree Puree: Impaired Presentation: Spoon (fed; ~2 ozs) Oral Phase Impairments: Reduced labial seal;Reduced lingual movement/coordination Oral Phase Functional Implications: Prolonged oral transit Pharyngeal Phase Impairments:  (none)   Solid   GO   Solid: Not tested         Regina SomKatherine Javen Ridings, MS, CCC-SLP Regina Fields 01/12/2016,1:59 PM

## 2016-01-12 NOTE — Progress Notes (Signed)
Pharmacy Antibiotic Note  Regina Fields is a 82 y.o. female admitted on 01/10/2016 with Hypernatremia and UTI .  Pharmacy has been consulted for Unasyn dosing.   Plan: Continue ampicillin/sulbactam 3gm IV every 12 hours based on CrCl <30 ml/min.   Height: 5\' 1"  (154.9 cm) Weight: 147 lb (66.7 kg) IBW/kg (Calculated) : 47.8  Temp (24hrs), Avg:98.6 F (37 C), Min:97.9 F (36.6 C), Max:99.1 F (37.3 C)   Recent Labs Lab 01/10/16 1148 01/10/16 1624 01/11/16 0623 01/12/16 0447  WBC 10.9  --  8.0  --   CREATININE 1.80*  --  1.43* 1.40*  LATICACIDVEN 2.2* 1.6  --   --     Estimated Creatinine Clearance: 25.7 mL/min (by C-G formula based on SCr of 1.4 mg/dL (H)).    Allergies  Allergen Reactions  . Alendronate Shortness Of Breath  . Candesartan Shortness Of Breath  . Amoxicillin Diarrhea  . Atorvastatin     Other reaction(s): Muscle Pain  . Ceftriaxone     Other reaction(s): Muscle Pain  . Levofloxacin     Other reaction(s): Unknown  . Lisinopril     Other reaction(s): Unknown  . Losartan     Other reaction(s): Unknown  . Nizatidine     Other reaction(s): Other (See Comments) Blurred vision  . Nsaids     Other reaction(s): Unknown  . Omeprazole Magnesium Nausea Only  . Psyllium     Other reaction(s): Unknown  . Sertaconazole     Other reaction(s): Unknown  . Sulfa Antibiotics Rash    Antimicrobials this admission: 12/02 ampicillin/sulbactam  >>   Dose adjustments this admission:  Microbiology results: 12/2 UCx: >100K GNR  Thank you for allowing pharmacy to be a part of this patient's care.  Cindi CarbonMary M Saraya Tirey, PharmD, BCPS Clinical Pharmacist 01/12/2016 10:22 AM

## 2016-01-13 ENCOUNTER — Inpatient Hospital Stay: Payer: Medicare Other

## 2016-01-13 DIAGNOSIS — R4182 Altered mental status, unspecified: Secondary | ICD-10-CM

## 2016-01-13 DIAGNOSIS — Z7189 Other specified counseling: Secondary | ICD-10-CM

## 2016-01-13 DIAGNOSIS — Z515 Encounter for palliative care: Secondary | ICD-10-CM

## 2016-01-13 LAB — URINE CULTURE

## 2016-01-13 LAB — BASIC METABOLIC PANEL
Anion gap: 5 (ref 5–15)
BUN: 27 mg/dL — ABNORMAL HIGH (ref 6–20)
CALCIUM: 8.4 mg/dL — AB (ref 8.9–10.3)
CO2: 26 mmol/L (ref 22–32)
CREATININE: 1.24 mg/dL — AB (ref 0.44–1.00)
Chloride: 115 mmol/L — ABNORMAL HIGH (ref 101–111)
GFR calc non Af Amer: 38 mL/min — ABNORMAL LOW (ref >60)
GFR, EST AFRICAN AMERICAN: 45 mL/min — AB (ref >60)
Glucose, Bld: 143 mg/dL — ABNORMAL HIGH (ref 65–99)
Potassium: 3.6 mmol/L (ref 3.5–5.1)
SODIUM: 146 mmol/L — AB (ref 135–145)

## 2016-01-13 MED ORDER — CEPHALEXIN 250 MG PO CAPS
250.0000 mg | ORAL_CAPSULE | Freq: Two times a day (BID) | ORAL | Status: DC
  Administered 2016-01-13 – 2016-01-14 (×2): 250 mg via ORAL
  Filled 2016-01-13 (×2): qty 1

## 2016-01-13 MED ORDER — MORPHINE SULFATE (CONCENTRATE) 10 MG/0.5ML PO SOLN: 5.0000 mg | ORAL | Status: DC | PRN

## 2016-01-13 MED ORDER — POTASSIUM CL IN DEXTROSE 5% 20 MEQ/L IV SOLN
20.0000 meq | INTRAVENOUS | Status: DC
  Administered 2016-01-13: 20 meq via INTRAVENOUS
  Filled 2016-01-13 (×2): qty 1000

## 2016-01-13 NOTE — Progress Notes (Signed)
[facility] - [place] at [facility]   PATIENT NAME: Regina ServantRuby Fields    MR#:  [id number]  DATE OF BIRTH:  10-Jun-1928  SUBJECTIVE:  CHIEF COMPLAINT:   Chief Complaint  Patient presents with  . Altered Mental Status   The patient is confused and demented, noncommunicative. Poor oral intake. REVIEW OF SYSTEMS:  Review of Systems  Unable to perform ROS: Dementia    DRUG ALLERGIES:   Allergies  Allergen Reactions  . Alendronate Shortness Of Breath  . Candesartan Shortness Of Breath  . Amoxicillin Diarrhea  . Atorvastatin     Other reaction(s): Muscle Pain  . Ceftriaxone     Other reaction(s): Muscle Pain  . Levofloxacin     Other reaction(s): Unknown  . Lisinopril     Other reaction(s): Unknown  . Losartan     Other reaction(s): Unknown  . Nizatidine     Other reaction(s): Other (See Comments) Blurred vision  . Nsaids     Other reaction(s): Unknown  . Omeprazole Magnesium Nausea Only  . Psyllium     Other reaction(s): Unknown  . Sertaconazole     Other reaction(s): Unknown  . Sulfa Antibiotics Rash   VITALS:  Blood pressure (!) 100/42, pulse 82, temperature 99.8 F (37.7 C), temperature source Axillary, resp. rate 20, height 5\' 1"  (1.549 m), weight 149 lb 11.2 oz (67.9 kg), SpO2 95 %. PHYSICAL EXAMINATION:  Physical Exam  Constitutional: She is well-developed, well-nourished, and in no distress.  HENT:  Head: Normocephalic.  Mouth/Throat: Oropharynx is clear and moist.  Eyes: Conjunctivae and EOM are normal.  Neck: Normal range of motion. Neck supple. No JVD present. No tracheal deviation present.  Cardiovascular: Normal rate, regular rhythm and normal heart sounds.  Exam reveals no gallop.   No murmur heard. Pulmonary/Chest: Effort normal and breath sounds normal. No respiratory distress. She has no wheezes. She has no rales.  Abdominal: Soft. Bowel sounds are normal. She exhibits no distension. There is no tenderness.  Musculoskeletal: She  exhibits no edema or tenderness.  Neurological:  Unable to exam due to dementia and confusion.  Skin: No rash noted. No erythema.  Psychiatric:  Demented and confused.   LABORATORY PANEL:   CBC  Recent Labs Lab 01/11/16 0623  WBC 8.0  HGB 12.2  HCT 36.6  PLT 106*   ------------------------------------------------------------------------------------------------------------------ Chemistries   Recent Labs Lab 01/11/16 0623 01/12/16 0447 01/13/16 0748  NA 158* 155* 146*  K 3.4* 3.5 3.6  CL 123* 122* 115*  CO2 27 27 26   GLUCOSE 110* 148* 143*  BUN 66* 50* 27*  CREATININE 1.43* 1.40* 1.24*  CALCIUM 8.7* 8.8* 8.4*  MG  --  2.5*  --   AST 38  --   --   ALT 50  --   --   ALKPHOS 56  --   --   BILITOT 1.1  --   --    RADIOLOGY:  Dg Chest Port 1 View  Result Date: 01/13/2016 CLINICAL DATA:  Dehydration. EXAM: PORTABLE CHEST 1 VIEW COMPARISON:  01/10/2016 FINDINGS: The lungs are clear except for mild chronic appearing interstitial coarsening. No airspace consolidation. No effusions. Hilar and mediastinal contours are unremarkable and unchanged. IMPRESSION: No acute cardiopulmonary findings. Electronically Signed   By: Ellery Plunkaniel R Mitchell M.D.   On: 01/13/2016 01:02   ASSESSMENT AND PLAN:   * Acute hypernatremia due to severe dehydration, improving. Continue D5W, follow-up sodium level.  * Acute kidney injury over CKD stage III due to  dehydration. Continue IV fluid support and follow-up BMP. Improving.  * Poor oral intake.  Per Speech study , dysphagia 1 w/ Honey consistency liquids; strict aspiration precautions; feeding support at all meals  * UTI Continue Unasyn. Change to keflex. Follow-up urine cultures: KLEBSIELLA PNEUMONIAE.  * Dementia with acute mental encephalopathy due to above. Aspiration and fall precaution.  * Hypertension. Blood pressure is in low side, hold blood pressure medications.  * Hypokalemia. Give IV fluid support with potassium,  improved.  Poorr prognosis, per palliative care consult, the patient need hospice care in skilled nursing facility. All the records are reviewed and case discussed with Care Management/Social Worker. Management plans discussed with palliative care team.  CODE STATUS: DO NOT RESUSCITATE  TOTAL TIME TAKING CARE OF THIS PATIENT: 36 minutes.   More than 50% of the time was spent in counseling/coordination of care: YES  POSSIBLE D/C IN 1-2 DAYS, DEPENDING ON CLINICAL CONDITION.   Shaune Pollackhen, Athel Merriweather M.D on 01/13/2016 at 5:23 PM  Between 7am to 6pm - Pager - [phone]  After 6pm go to [email or web address] - Scientist, research (life sciences)password EPAS [email or web address]  [facility] [place] [facility]  Office  [phone]  CC: Primary care physician; No PCP Per Patient  Note: This dictation was prepared with Dragon dictation along with smaller phrase technology. Any transcriptional errors that result from this process are unintentional.

## 2016-01-13 NOTE — Clinical Social Work Note (Signed)
Physician has stated that patient is to discharge tomorrow to [facility]. [place] SpanielMonica Verbon Giangregorio MSW,LcSW [phone]

## 2016-01-13 NOTE — Consult Note (Signed)
Consultation Note Date: 01/13/2016   Patient Name: Regina Fields  DOB: 21-Feb-1928  MRN: [id number]  Age / Sex: 82 y.o., female  PCP: No Pcp Per Patient Referring Physician: Demetrios Loll, MD  Reason for Consultation: Establishing goals of care  HPI/Patient Profile: 82 y.o. female  with past medical history of osteoarthritis, migraines, IBS, hypothyroidism, hypertension, GERD, dementia, and anxiety admitted on 01/10/2016 with confusion and agitation. Patient with baseline dementia. In ED, patient severely hypernatremic with sodium of 165 due to severe dehydration. Positive for UTI-started on antibiotics. Acute kidney injury with CKD III-receiving IVF. Speech consulted and recommending dysphagia diet with honey thick liquids. Palliative medicine consultation for goals of care.   Clinical Assessment and Goals of Care: Initially met patient at bedside. She opens eyes to voice but will not answer questions or follow commands. She moans when she is touched. Baseline dementia. Per nurse and by reviewing the chart, patient is taking sips and refusing to eat.   The patient has one child, Regina Fields, who lives in [place] with his wife Regina Fields. Spoke with Regina Fields via telephone. It has been challenging for her and Regina Fields to make trips to [place] to visit his mom, but she tells me they call her once a week. Regina Fields tells me of her decline the past 3-4 years. She was in independent living and then assisted living for a very short time, before they had to transition her to skilled at [facility]. Regina Fields tells me her and Regina Fields understand the natural trajectory of dementia and this decline is to be expected. They understand she is not eating or drinking enough to sustain herself and that this dehydration will continue to be a cycle. They understand she has a poor prognosis. Regina Fields asked if the IV fluids and antibiotics were keeping her alive and I discussed that  these are life prolonging measures. Discussed my concern that despite aggressive IVF and antibiotics for three days, she has not shown improvement in cognition or the desire to eat. Discussed natural trajectory of dementia and EOL expectations.   Discussed that she is eligible for hospice services, whether that be at [facility] or residential hospice facility. I did express my concern that time will most likely be short if the patient continues to refuse food or drink. Regina Fields understands the conversation and will update son, Regina Fields. He is a professor at [employer] and this week is finals week, therefore per Regina Fields, he would like for [place] to talk with her about Regina Fields. Encouraged she talk with Regina Fields and write down any questions he may have. Regina Fields tells me she will try to have Regina Fields call in the morning to further discuss hospice.   Answered questions and offered support.   HCPOA-son, Regina Fields   SUMMARY OF RECOMMENDATIONS    DNR/DNI  Family in [place]. Discussed in detail [place], aggressive medical interventions (IVF/ABX), prognosis, and hospice services with daughter-in-law via telephone. She is going to discuss hospice with son and f/u tomorrow, 12/6.  Patient with dementia, severe  functional status decline, and malnutrition/refusing to eat. She is eligible for hospice services.  Disposition: Back to [facility] with hospice vs. Hospice facility.   PMT will f/u with family tomorrow, 12/6.   Code Status/Advance Care Planning:  DNR   Symptom Management:   Roxanol 78m PO q4h prn pain/dyspnea  Palliative Prophylaxis:   Aspiration, Delirium Protocol, Frequent Pain Assessment, Oral Care and Turn Reposition  Additional Recommendations (Limitations, Scope, Preferences):  Full Scope Treatment-except DNR/DNI  Psycho-social/Spiritual:   Desire for further Chaplaincy support:no  Additional Recommendations: Caregiving  Support/Resources and Education on Hospice  Prognosis:   Unable to  determine-guarded. Patient with dementia and malnutrition.    Discharge Planning: To Be Determined back to facility with hospice services vs. Hospice home     Primary Diagnoses: Present on Admission: . Hypernatremia   I have reviewed the medical record, interviewed the patient and family, and examined the patient. The following aspects are pertinent.  Past Medical History:  Diagnosis Date  . Anxiety   . Dementia   . GERD (gastroesophageal reflux disease)   . Hypertension   . Hypothyroidism   . IBS (irritable bowel syndrome)   . Migraine   . Osteoarthritis    Social History   Social History  . Marital status: Divorced    Spouse name: N/A  . Number of children: N/A  . Years of education: N/A   Social History Main Topics  . Smoking status: Never Smoker  . Smokeless tobacco: Never Used  . Alcohol use Not on file  . Drug use: Unknown  . Sexual activity: Not Currently   Other Topics Concern  . Not on file   Social History Narrative  . No narrative on file   No family history on file. Scheduled Meds: . cephALEXin  250 mg Oral Q12H  . chlorhexidine  15 mL Mouth Rinse BID  . enoxaparin (LOVENOX) injection  30 mg Subcutaneous Q24H  . levothyroxine  137 mcg Oral Q0600  . mouth rinse  15 mL Mouth Rinse q12n4p  . sodium chloride flush  3 mL Intravenous Q12H   Continuous Infusions: . dextrose 5 % with KCl 20 mEq / L 20 mEq (01/13/16 1015)   PRN Meds:.acetaminophen **OR** acetaminophen, albuterol, bisacodyl, morphine CONCENTRATE, ondansetron **OR** ondansetron (ZOFRAN) IV, oxyCODONE Medications Prior to Admission:  Prior to Admission medications   Medication Sig Start Date End Date Taking? Authorizing Provider  acetaminophen (TYLENOL) 325 MG tablet Take 650 mg by mouth 3 (three) times daily as needed for mild pain.    Yes Historical Provider, MD  albuterol (PROVENTIL HFA;VENTOLIN HFA) 108 (90 Base) MCG/ACT inhaler Inhale 2 puffs into the lungs every 6 (six) hours as  needed for wheezing or shortness of breath. 12/13/15  Yes DOrbie Pyo MD  amLODipine (NORVASC) 5 MG tablet Take 5 mg by mouth daily.   Yes Historical Provider, MD  aspirin 81 MG chewable tablet Chew 81 mg by mouth daily.   Yes Historical Provider, MD  furosemide (LASIX) 40 MG tablet Take 40 mg by mouth daily.   Yes Historical Provider, MD  ipratropium-albuterol (DUONEB) 0.5-2.5 (3) MG/3ML SOLN Take 3 mLs by nebulization every 6 (six) hours. 03/13/15  Yes Srikar Sudini, MD  levothyroxine (SYNTHROID, LEVOTHROID) 137 MCG tablet Take 137 mcg by mouth daily before breakfast.   Yes Historical Provider, MD  montelukast (SINGULAIR) 10 MG tablet Take 10 mg by mouth every evening.   Yes Historical Provider, MD  Multiple Vitamin (THEREMS) TABS Take 1 tablet by  mouth daily.   Yes Historical Provider, MD  polyethylene glycol (MIRALAX / GLYCOLAX) packet Take 17 g by mouth at bedtime.   Yes Historical Provider, MD  traMADol (ULTRAM) 50 MG tablet Take 100 mg by mouth every 6 (six) hours as needed for severe pain.    Yes Historical Provider, MD   Allergies  Allergen Reactions  . Alendronate Shortness Of Breath  . Candesartan Shortness Of Breath  . Amoxicillin Diarrhea  . Atorvastatin     Other reaction(s): Muscle Pain  . Ceftriaxone     Other reaction(s): Muscle Pain  . Levofloxacin     Other reaction(s): Unknown  . Lisinopril     Other reaction(s): Unknown  . Losartan     Other reaction(s): Unknown  . Nizatidine     Other reaction(s): Other (See Comments) Blurred vision  . Nsaids     Other reaction(s): Unknown  . Omeprazole Magnesium Nausea Only  . Psyllium     Other reaction(s): Unknown  . Sertaconazole     Other reaction(s): Unknown  . Sulfa Antibiotics Rash   Review of Systems  Unable to perform ROS: Dementia    Physical Exam  Constitutional: She appears ill.  Cardiovascular: Regular rhythm.  Exam reveals distant heart sounds.   Pulmonary/Chest: Tachypnea noted. She has  decreased breath sounds.  Abdominal: Soft. Bowel sounds are normal.  Musculoskeletal: She exhibits edema (generalized).  Neurological: She is alert. She is disoriented.  Skin: Skin is dry. There is pallor.  BLE cool  Psychiatric: She is agitated. Cognition and memory are impaired. She is noncommunicative.  Moans when touched.  She is inattentive.  Nursing note and vitals reviewed.  Vital Signs: BP (!) 100/42 (BP Location: Right Arm)   Pulse 82   Temp 99.8 F (37.7 C) (Axillary)   Resp 20   Ht 5' 1"  (1.549 m)   Wt 67.9 kg (149 lb 11.2 oz)   SpO2 95%   BMI 28.29 kg/m  Pain Assessment: 0-10   Pain Score: Asleep  SpO2: SpO2: 95 % O2 Device:SpO2: 95 % O2 Flow Rate: .   IO: Intake/output summary:   Intake/Output Summary (Last 24 hours) at 01/13/16 1644 Last data filed at 01/13/16 1037  Gross per 24 hour  Intake             1579 ml  Output                0 ml  Net             1579 ml   LBM: Last BM Date: 01/12/16 Baseline Weight: Weight: 68.5 kg (151 lb 0.2 oz) Most recent weight: Weight: 67.9 kg (149 lb 11.2 oz)     Palliative Assessment/Data: PPS 20%   Flowsheet Rows   Flowsheet Row Most Recent Value  Intake Tab  Referral Department  Hospitalist  Unit at Time of Referral  Med/Surg Unit  Palliative Care Primary Diagnosis  Neurology  Date Notified  01/11/16  Palliative Care Type  New Palliative care  Reason for referral  Clarify Goals of Care  Date of Admission  01/10/16  # of days IP prior to Palliative referral  1  Clinical Assessment  Palliative Performance Scale Score  20%  Psychosocial & Spiritual Assessment  Palliative Care Outcomes  Patient/Family meeting held?  Yes  Who was at the meeting?  Family in [place] with daughter-in-law via telephone  Palliative Care Outcomes  Clarified goals of care, Counseled regarding hospice, Provided psychosocial or spiritual support  Time In: 1330 Time Out: 1440 Time Total: 6mn Greater than 50%  of this time  was spent counseling and coordinating care related to the above assessment and plan.  Signed by:  MIhor Dow FNP-C Palliative Medicine Team  Phone: [phone]: [phone]  Please contact Palliative Medicine Team phone at [phone] questions and concerns.  For individual provider: See AShea Evans

## 2016-01-13 NOTE — Plan of Care (Signed)
Problem: SLP Dysphagia Goals Goal: Misc Dysphagia Goal Pt will safely tolerate po diet of least restrictive consistency w/ no overt s/s of aspiration noted by Staff/pt/family x3 sessions.    

## 2016-01-13 NOTE — Progress Notes (Signed)
Pt. Respirations shallow and seems labored, posterior lung sounds with crackles and rales. MD notified and orders placed for chest x ray.

## 2016-01-14 DIAGNOSIS — E860 Dehydration: Secondary | ICD-10-CM

## 2016-01-14 LAB — BASIC METABOLIC PANEL
Anion gap: 7 (ref 5–15)
BUN: 19 mg/dL (ref 6–20)
CHLORIDE: 112 mmol/L — AB (ref 101–111)
CO2: 24 mmol/L (ref 22–32)
CREATININE: 1.08 mg/dL — AB (ref 0.44–1.00)
Calcium: 8.3 mg/dL — ABNORMAL LOW (ref 8.9–10.3)
GFR calc non Af Amer: 45 mL/min — ABNORMAL LOW (ref >60)
GFR, EST AFRICAN AMERICAN: 53 mL/min — AB (ref >60)
Glucose, Bld: 153 mg/dL — ABNORMAL HIGH (ref 65–99)
POTASSIUM: 3.6 mmol/L (ref 3.5–5.1)
Sodium: 143 mmol/L (ref 135–145)

## 2016-01-14 MED ORDER — CEPHALEXIN 250 MG PO CAPS: 250.0000 mg | ORAL_CAPSULE | Freq: Two times a day (BID) | ORAL | 0 refills | Status: DC

## 2016-01-14 MED ORDER — BISACODYL 10 MG RE SUPP: 10.0000 mg | Freq: Every day | RECTAL | 0 refills | Status: AC | PRN

## 2016-01-14 NOTE — Care Management Important Message (Signed)
Important Message  Patient Details  Name: Tonye RoyaltyRuby H Krupinski MRN: [id number] Date of Birth: 01/20/1929   Medicare Important Message Given:  Yes    Chapman FitchBOWEN, Ramandeep Arington T, RN 01/14/2016, 9:59 AM

## 2016-01-14 NOTE — Discharge Summary (Signed)
[facility] - [place] at [facility]   PATIENT NAME: Regina Fields    MR#:  [id number]  DATE OF BIRTH:  10/12/1928  DATE OF ADMISSION:  01/10/2016   ADMITTING PHYSICIAN: Milagros LollSrikar Sudini, MD  DATE OF DISCHARGE: 01/14/2016 PRIMARY CARE PHYSICIAN: No PCP Per Patient   ADMISSION DIAGNOSIS:  Dehydration [E86.0] Hypernatremia [E87.0] Altered mental status, unspecified altered mental status type [R41.82] DISCHARGE DIAGNOSIS:  Active Problems:   Hypernatremia   Palliative care encounter   Goals of care, counseling/discussion   Altered mental status   Encounter for hospice care discussion ARF on CKD3 UTI SECONDARY DIAGNOSIS:   Past Medical History:  Diagnosis Date  . Anxiety   . Dementia   . GERD (gastroesophageal reflux disease)   . Hypertension   . Hypothyroidism   . IBS (irritable bowel syndrome)   . Migraine   . Osteoarthritis    HOSPITAL COURSE:    Acute hypernatremia due to severe dehydration, improving. Continue D5W, follow-up sodium level.  * Acute kidney injury over CKD stage III due to dehydration. Improved with IV fluid support.  * Poor oral intake.  Per Speech study , dysphagia 1 w/ Honey consistency liquids; strict aspiration precautions; feeding support at all meals  * UTI Continue Unasyn. Change to keflex. Follow-up urine cultures: KLEBSIELLA PNEUMONIAE.  * Dementia with acute mental encephalopathy due to above. Aspiration and fall precaution.  * Hypertension. Blood pressure is in low side, hold blood pressure medications.  * Hypokalemia. Give IV fluid support with potassium, improved.  Poorr prognosis, per palliative care consult, the patient need hospice care in skilled nursing facility.  DISCHARGE CONDITIONS:  Stable, discharge to SNF today. CONSULTS OBTAINED:   DRUG ALLERGIES:   Allergies  Allergen Reactions  . Alendronate Shortness Of Breath  . Candesartan Shortness Of Breath  . Amoxicillin Diarrhea  .  Atorvastatin     Other reaction(s): Muscle Pain  . Ceftriaxone     Other reaction(s): Muscle Pain  . Levofloxacin     Other reaction(s): Unknown  . Lisinopril     Other reaction(s): Unknown  . Losartan     Other reaction(s): Unknown  . Nizatidine     Other reaction(s): Other (See Comments) Blurred vision  . Nsaids     Other reaction(s): Unknown  . Omeprazole Magnesium Nausea Only  . Psyllium     Other reaction(s): Unknown  . Sertaconazole     Other reaction(s): Unknown  . Sulfa Antibiotics Rash   DISCHARGE MEDICATIONS:     Medication List    STOP taking these medications   amLODipine 5 MG tablet Commonly known as:  NORVASC   furosemide 40 MG tablet Commonly known as:  LASIX   traMADol 50 MG tablet Commonly known as:  ULTRAM     TAKE these medications   acetaminophen 325 MG tablet Commonly known as:  TYLENOL Take 650 mg by mouth 3 (three) times daily as needed for mild pain.   albuterol 108 (90 Base) MCG/ACT inhaler Commonly known as:  PROVENTIL HFA;VENTOLIN HFA Inhale 2 puffs into the lungs every 6 (six) hours as needed for wheezing or shortness of breath.   aspirin 81 MG chewable tablet Chew 81 mg by mouth daily.   bisacodyl 10 MG suppository Commonly known as:  DULCOLAX Place 1 suppository (10 mg total) rectally daily as needed for moderate constipation.   cephALEXin 250 MG capsule Commonly known as:  KEFLEX Take 1 capsule (250 mg total) by mouth every 12 (twelve) hours.  ipratropium-albuterol 0.5-2.5 (3) MG/3ML Soln Commonly known as:  DUONEB Take 3 mLs by nebulization every 6 (six) hours.   levothyroxine 137 MCG tablet Commonly known as:  SYNTHROID, LEVOTHROID Take 137 mcg by mouth daily before breakfast.   montelukast 10 MG tablet Commonly known as:  SINGULAIR Take 10 mg by mouth every evening.   polyethylene glycol packet Commonly known as:  MIRALAX / GLYCOLAX Take 17 g by mouth at bedtime.   THEREMS Tabs Take 1 tablet by mouth  daily.        DISCHARGE INSTRUCTIONS:  See AVS.  If you experience worsening of your admission symptoms, develop shortness of breath, life threatening emergency, suicidal or homicidal thoughts you must seek medical attention immediately by calling 911 or calling your MD immediately  if symptoms less severe.  You Must read complete instructions/literature along with all the possible adverse reactions/side effects for all the Medicines you take and that have been prescribed to you. Take any new Medicines after you have completely understood and accpet all the possible adverse reactions/side effects.   Please note  You were cared for by a hospitalist during your hospital stay. If you have any questions about your discharge medications or the care you received while you were in the hospital after you are discharged, you can call the unit and asked to speak with the hospitalist on call if the hospitalist that took care of you is not available. Once you are discharged, your primary care physician will handle any further medical issues. Please note that NO REFILLS for any discharge medications will be authorized once you are discharged, as it is imperative that you return to your primary care physician (or establish a relationship with a primary care physician if you do not have one) for your aftercare needs so that they can reassess your need for medications and monitor your lab values.    On the day of Discharge:  VITAL SIGNS:  Blood pressure (!) 128/50, pulse (!) 103, temperature (!) 100.4 F (38 C), temperature source Oral, resp. rate 19, height 5\' 1"  (1.549 m), weight 149 lb 11.2 oz (67.9 kg), SpO2 96 %. PHYSICAL EXAMINATION:  GENERAL:  82 y.o.-year-old patient lying in the bed with no acute distress.  EYES: No scleral icterus. Extraocular muscles intact.  HEENT: Head atraumatic, normocephalic. Oropharynx and nasopharynx clear.  NECK:  Supple, no jugular venous distention. No thyroid  enlargement, no tenderness.  LUNGS: Normal breath sounds bilaterally, no wheezing, rales,rhonchi or crepitation. No use of accessory muscles of respiration.  CARDIOVASCULAR: S1, S2 normal. No murmurs, rubs, or gallops.  ABDOMEN: Soft, non-tender, non-distended. Bowel sounds present. No organomegaly or mass.  EXTREMITIES: No pedal edema, cyanosis, or clubbing.  NEUROLOGIC: unable to exam.  PSYCHIATRIC: The patient is demented. SKIN: No obvious rash, lesion, or ulcer.  DATA REVIEW:   CBC  Recent Labs Lab 01/11/16 0623  WBC 8.0  HGB 12.2  HCT 36.6  PLT 106*    Chemistries   Recent Labs Lab 01/11/16 0623 01/12/16 0447  01/14/16 0501  NA 158* 155*  < > 143  K 3.4* 3.5  < > 3.6  CL 123* 122*  < > 112*  CO2 27 27  < > 24  GLUCOSE 110* 148*  < > 153*  BUN 66* 50*  < > 19  CREATININE 1.43* 1.40*  < > 1.08*  CALCIUM 8.7* 8.8*  < > 8.3*  MG  --  2.5*  --   --   AST 38  --   --   --  ALT 50  --   --   --   ALKPHOS 56  --   --   --   BILITOT 1.1  --   --   --   < > = values in this interval not displayed.   Microbiology Results  Results for orders placed or performed during the hospital encounter of 01/10/16  Urine culture     Status: Abnormal   Collection Time: 01/10/16 11:49 AM  Result Value Ref Range Status   Specimen Description URINE, RANDOM  Final   Special Requests NONE  Final   Culture >=100,000 COLONIES/mL KLEBSIELLA PNEUMONIAE (A)  Final   Report Status 01/13/2016 FINAL  Final   Organism ID, Bacteria KLEBSIELLA PNEUMONIAE (A)  Final      Susceptibility   Klebsiella pneumoniae - MIC*    AMPICILLIN 16 RESISTANT Resistant     CEFAZOLIN <=4 SENSITIVE Sensitive     CEFTRIAXONE <=1 SENSITIVE Sensitive     CIPROFLOXACIN <=0.25 SENSITIVE Sensitive     GENTAMICIN <=1 SENSITIVE Sensitive     IMIPENEM 0.5 SENSITIVE Sensitive     NITROFURANTOIN <=16 SENSITIVE Sensitive     TRIMETH/SULFA <=20 SENSITIVE Sensitive     AMPICILLIN/SULBACTAM 4 SENSITIVE Sensitive      PIP/TAZO <=4 SENSITIVE Sensitive     Extended ESBL NEGATIVE Sensitive     * >=100,000 COLONIES/mL KLEBSIELLA PNEUMONIAE  MRSA PCR Screening     Status: None   Collection Time: 01/10/16  5:56 PM  Result Value Ref Range Status   MRSA by PCR NEGATIVE NEGATIVE Final    Comment:        The GeneXpert MRSA Assay (FDA approved for NASAL specimens only), is one component of a comprehensive MRSA colonization surveillance program. It is not intended to diagnose MRSA infection nor to guide or monitor treatment for MRSA infections.     RADIOLOGY:  No results found.   Management plans discussed with the patient, family and they are in agreement.  CODE STATUS:     Code Status Orders        Start     Ordered   01/11/16 1234  Do not attempt resuscitation (DNR)  Continuous    Question Answer Comment  In the event of cardiac or respiratory ARREST Do not call a "code blue"   In the event of cardiac or respiratory ARREST Do not perform Intubation, CPR, defibrillation or ACLS   In the event of cardiac or respiratory ARREST Use medication by any route, position, wound care, and other measures to relive pain and suffering. May use oxygen, suction and manual treatment of airway obstruction as needed for comfort.      01/11/16 1233    Code Status History    Date Active Date Inactive Code Status Order ID Comments User Context   01/10/2016  1:23 PM 01/11/2016 12:33 PM Full Code [id number]  Milagros LollSrikar Sudini, MD ED   03/10/2015 12:16 AM 03/13/2015  7:28 PM Full Code [id number]  Gery Prayebby Crosley, MD ED   03/01/2015  6:51 PM 03/05/2015  4:42 PM Full Code [id number]  Katha HammingSnehalatha Konidena, MD ED      TOTAL TIME TAKING CARE OF THIS PATIENT: 33 minutes.    Shaune Pollackhen, Glynnis Gavel M.D on 01/14/2016 at 9:14 AM  Between 7am to 6pm - Pager - [phone]  After 6pm go to [email or web address] - Scientist, research (life sciences)password [email or web address]  [facility] [place] [facility]  Office  [phone]  CC: Primary care physician; No PCP Per Patient   Note:  This dictation  was prepared with Dragon dictation along with smaller phrase technology. Any transcriptional errors that result from this process are unintentional.

## 2016-01-14 NOTE — Progress Notes (Signed)
Speech Language Pathology Treatment: Dysphagia  Patient Details Name: Regina Fields MRN: [id number] DOB: May 27, 1928 Today's Date: 01/14/2016 Time: 1130-1200 SLP Time Calculation (min) (ACUTE ONLY): 30 min  Assessment / Plan / Recommendation Clinical Impression  Pt appears at increased risk for aspiration moreso w/ liquids w/ coughing and/or throat clearing noted post tsp trials of Nectar consistency liquids during attempted upgrade of diet/liquid consistency. Pt exhibits min oral phase deficits c/b decreased awareness during oral intake. Suspected delay of swallowing secondary to audible swallows noted during trials. Pt appeared to adquately tolerate tsp trials of Honey consistency liquids w/ no immediate, overt s/s of aspiration noted. Oral phase c/b min deficits for bolus management and oral clearing but given time was able to clear. Pt required feeding support w/ all trials. Due to pt's increased risk for dysphagia w/ aspiration and sequelae of such including Pneumonia, pt would benefit from a modified dsyphagia diet of Dysphagia 1 w/ Honey liquids by TSP w/ strict aspiration precautions; meds in Puree. ST will f/u w/ trials to upgrade and/or objective swallow assessment as indicated.    HPI    is a 82 y.o. female with a known history of Hypertension, dementia, irritable bowel syndrome who is a resident of local nursing home was brought to the emergency room after she was noticed to be more confused than normal. She does have baseline confusion due to dementia but has been close to non-verbal. Answers in one or 2 words occasionally. Agitated at times. Here in the emergency room blood work has shown patient to be severely hypernatremic with sodium of 165. Dehydration. Patient was on dysphagia 2 diet during her last discharge 6 months back.       SLP Plan  Continue with current plan of care     Recommendations  Diet recommendations: Dysphagia 2 (fine chop);Honey-thick liquid Liquids provided  via: Teaspoon Medication Administration: Crushed with puree Supervision: Staff to assist with self feeding;Full supervision/cueing for compensatory strategies;Trained caregiver to feed patient Compensations: Minimize environmental distractions;Slow rate;Small sips/bites;Lingual sweep for clearance of pocketing;Multiple dry swallows after each bite/sip;Follow solids with liquid Postural Changes and/or Swallow Maneuvers: Seated upright 90 degrees;Upright 30-60 min after meal                General recommendations:  (Palliative Care consult; Dietician f/u) Oral Care Recommendations: Oral care BID;Staff/trained caregiver to provide oral care Follow up Recommendations: Skilled Nursing facility Plan: Continue with current plan of care       GO               Regina SomKatherine Andrez Lieurance, MS, CCC-SLP Regina Fields 01/14/2016, 1:18 PM

## 2016-01-14 NOTE — Progress Notes (Signed)
Cheryl RN called from [facility] to ask when Keflex should be stopped.  Dr Imogene Burnhen notified and said the pt should receive 6 doses from when started.  She has received 2 doses here and will [facility].  [facility]

## 2016-01-14 NOTE — Discharge Instructions (Signed)
Dysphagia 1 diet with honey liquids, with asist Aspiration and fall precaution. Hospice care.

## 2016-01-14 NOTE — Progress Notes (Signed)
Daily Progress Note   Patient Name: Regina Fields       Date: 01/14/2016 DOB: 03/20/1928  Age: 82 y.o. MRN#: [id number] Attending Physician: No att. providers found Primary Care Physician: No PCP Per Patient Admit Date: 01/10/2016  Reason for Consultation/Follow-up: Establishing goals of care  Subjective: Upon arrival to room, patient alert but disoriented. Not answering questions or following commands. Moans when touched. Plan is to discharge back to SNF today. Family has not followed up with me this morning about hospice.   Length of Stay: 4  Current Medications:  Scheduled Meds:   Continuous Infusions:  PRN Meds:  Physical Exam  Constitutional: She appears ill.  Cardiovascular: Regular rhythm.  Exam reveals distant heart sounds.   Pulmonary/Chest: No accessory muscle usage. No respiratory distress. She has decreased breath sounds.  Abdominal: Soft. Bowel sounds are normal.  Musculoskeletal: She exhibits edema (generalized).  Neurological: She is alert. She is disoriented.  Skin: Skin is warm and dry. There is pallor.  Psychiatric: Her speech is delayed. Cognition and memory are impaired. She is inattentive.  Nursing note and vitals reviewed.          Vital Signs: BP (!) 128/50 (BP Location: Right Arm)   Pulse (!) 103   Temp (!) 100.4 F (38 C) (Oral)   Resp 19   Ht 5\' 1"  (1.549 m)   Wt 67.9 kg (149 lb 11.2 oz)   SpO2 96%   BMI 28.29 kg/m  SpO2: SpO2: 96 % O2 Device: O2 Device: Not Delivered O2 Flow Rate:    Intake/output summary:   Intake/Output Summary (Last 24 hours) at 01/14/16 1648 Last data filed at 01/14/16 1132  Gross per 24 hour  Intake          1580.75 ml  Output                0 ml  Net          1580.75 ml   LBM: Last BM Date: 01/12/16 Baseline  Weight: Weight: 68.5 kg (151 lb 0.2 oz) Most recent weight: Weight: 67.9 kg (149 lb 11.2 oz)  Palliative Assessment/Data: PPS 20%   Flowsheet Rows   Flowsheet Row Most Recent Value  Intake Tab  Referral Department  Hospitalist  Unit at Time of Referral  Med/Surg Unit  Palliative  Care Primary Diagnosis  Neurology  Date Notified  01/11/16  Palliative Care Type  New Palliative care  Reason for referral  Clarify Goals of Care  Date of Admission  01/10/16  # of days IP prior to Palliative referral  1  Clinical Assessment  Palliative Performance Scale Score  20%  Psychosocial & Spiritual Assessment  Palliative Care Outcomes  Patient/Family meeting held?  No  Who was at the meeting?  Family in [place]-spoke with daughter-in-law via telephone  Palliative Care Outcomes  Clarified goals of care, Counseled regarding hospice, Provided psychosocial or spiritual support      Patient Active Problem List   Diagnosis Date Noted  . Palliative care encounter   . Goals of care, counseling/discussion   . Altered mental status   . Encounter for hospice care discussion   . Hypernatremia 01/10/2016  . Healthcare-associated pneumonia 03/09/2015  . HTN (hypertension) 03/09/2015  . Dementia 03/09/2015  . Dyslipidemia 03/09/2015  . HCAP (healthcare-associated pneumonia) 03/09/2015  . Acute respiratory failure (HCC) 03/01/2015    Palliative Care Assessment & Plan   Patient Profile: 82 y.o. female  with past medical history of osteoarthritis, migraines, IBS, hypothyroidism, hypertension, GERD, dementia, and anxiety admitted on 01/10/2016 with confusion and agitation. Patient with baseline dementia. In ED, patient severely hypernatremic with sodium of 165 due to severe dehydration. Positive for UTI-started on antibiotics. Acute kidney injury with CKD III-receiving IVF. Speech consulted and recommending dysphagia diet with honey thick liquids. Palliative medicine consultation for goals of care.    Assessment: Acute hypernatremia due to severe dehydration Acute kidney injury CKD stage III UTI Poor oral intake Dementia with encephalopathy  Recommendations/Plan:  DNR/DNI  Spoke with daughter-in-law via telephone yesterday regarding hospice services. She was agreeable to this but wanted to talk with her husband (patient's son) and follow up with me. Unfortunately, I did not hear back from her this morning and patient is to be discharged back to [facility].   Recommend hospice services at the facility. Dr. Imogene Burnhen updated.   Code Status: DNR   Code Status Orders        Start     Ordered   01/11/16 1234  Do not attempt resuscitation (DNR)  Continuous    Question Answer Comment  In the event of cardiac or respiratory ARREST Do not call a "code blue"   In the event of cardiac or respiratory ARREST Do not perform Intubation, CPR, defibrillation or ACLS   In the event of cardiac or respiratory ARREST Use medication by any route, position, wound care, and other measures to relive pain and suffering. May use oxygen, suction and manual treatment of airway obstruction as needed for comfort.      01/11/16 1233    Code Status History    Date Active Date Inactive Code Status Order ID Comments User Context   01/10/2016  1:23 PM 01/11/2016 12:33 PM Full Code [id number]  Milagros LollSrikar Sudini, MD ED   03/10/2015 12:16 AM 03/13/2015  7:28 PM Full Code [id number]  Gery Prayebby Crosley, MD ED   03/01/2015  6:51 PM 03/05/2015  4:42 PM Full Code [id number]  Katha HammingSnehalatha Konidena, MD ED       Prognosis:   < 6 months-guarded. May be significantly less than 6 months in the setting of advanced dementia with severe functional and nutritional status decline.   Discharge Planning:  Skilled Nursing Facility with Hospice  Care plan was discussed with RN and Dr. Imogene Burnhen.   Thank you for allowing the Palliative Medicine  Team to assist in the care of this patient.   Time In: 1045 Time Out: 1100 Total Time 15min Prolonged  Time Billed  no       Greater than 50%  of this time was spent counseling and coordinating care related to the above assessment and plan.  Vennie HomansMegan Kevis Qu, FNP-C Palliative Medicine Team  Phone: [phone] Fax: [phone]  Please contact Palliative Medicine Team phone at [phone] for questions and concerns.

## 2016-02-24 ENCOUNTER — Emergency Department: Payer: Medicare Other

## 2016-02-24 ENCOUNTER — Encounter: Payer: Self-pay | Admitting: *Deleted

## 2016-02-24 ENCOUNTER — Inpatient Hospital Stay
Admission: EM | Admit: 2016-02-24 | Discharge: 2016-02-27 | DRG: 872 | Disposition: A | Discharge status: Skilled Nursing Facility | Payer: Medicare Other | Attending: Internal Medicine | Admitting: Internal Medicine

## 2016-02-24 DIAGNOSIS — Z66 Do not resuscitate: Secondary | ICD-10-CM | POA: Diagnosis present

## 2016-02-24 DIAGNOSIS — N179 Acute kidney failure, unspecified: Secondary | ICD-10-CM | POA: Diagnosis present

## 2016-02-24 DIAGNOSIS — N390 Urinary tract infection, site not specified: Secondary | ICD-10-CM | POA: Diagnosis present

## 2016-02-24 DIAGNOSIS — Z7982 Long term (current) use of aspirin: Secondary | ICD-10-CM | POA: Diagnosis not present

## 2016-02-24 DIAGNOSIS — R627 Adult failure to thrive: Secondary | ICD-10-CM | POA: Diagnosis present

## 2016-02-24 DIAGNOSIS — Z888 Allergy status to other drugs, medicaments and biological substances status: Secondary | ICD-10-CM | POA: Diagnosis not present

## 2016-02-24 DIAGNOSIS — A419 Sepsis, unspecified organism: Secondary | ICD-10-CM | POA: Diagnosis not present

## 2016-02-24 DIAGNOSIS — E039 Hypothyroidism, unspecified: Secondary | ICD-10-CM | POA: Diagnosis present

## 2016-02-24 DIAGNOSIS — R0602 Shortness of breath: Secondary | ICD-10-CM

## 2016-02-24 DIAGNOSIS — F0280 Dementia in other diseases classified elsewhere without behavioral disturbance: Secondary | ICD-10-CM | POA: Diagnosis present

## 2016-02-24 DIAGNOSIS — M6281 Muscle weakness (generalized): Secondary | ICD-10-CM

## 2016-02-24 DIAGNOSIS — E860 Dehydration: Secondary | ICD-10-CM | POA: Diagnosis present

## 2016-02-24 DIAGNOSIS — J101 Influenza due to other identified influenza virus with other respiratory manifestations: Secondary | ICD-10-CM | POA: Diagnosis present

## 2016-02-24 DIAGNOSIS — K219 Gastro-esophageal reflux disease without esophagitis: Secondary | ICD-10-CM | POA: Diagnosis present

## 2016-02-24 DIAGNOSIS — Z881 Allergy status to other antibiotic agents status: Secondary | ICD-10-CM

## 2016-02-24 DIAGNOSIS — B961 Klebsiella pneumoniae [K. pneumoniae] as the cause of diseases classified elsewhere: Secondary | ICD-10-CM | POA: Diagnosis present

## 2016-02-24 DIAGNOSIS — E870 Hyperosmolality and hypernatremia: Secondary | ICD-10-CM | POA: Diagnosis present

## 2016-02-24 DIAGNOSIS — Z7951 Long term (current) use of inhaled steroids: Secondary | ICD-10-CM

## 2016-02-24 DIAGNOSIS — Z7401 Bed confinement status: Secondary | ICD-10-CM | POA: Diagnosis not present

## 2016-02-24 DIAGNOSIS — Z882 Allergy status to sulfonamides status: Secondary | ICD-10-CM | POA: Diagnosis not present

## 2016-02-24 DIAGNOSIS — Z79899 Other long term (current) drug therapy: Secondary | ICD-10-CM | POA: Diagnosis not present

## 2016-02-24 DIAGNOSIS — I10 Essential (primary) hypertension: Secondary | ICD-10-CM | POA: Diagnosis present

## 2016-02-24 DIAGNOSIS — Z886 Allergy status to analgesic agent status: Secondary | ICD-10-CM | POA: Diagnosis not present

## 2016-02-24 DIAGNOSIS — Z880 Allergy status to penicillin: Secondary | ICD-10-CM | POA: Diagnosis not present

## 2016-02-24 DIAGNOSIS — A4189 Other specified sepsis: Principal | ICD-10-CM | POA: Diagnosis present

## 2016-02-24 LAB — LACTIC ACID, PLASMA
LACTIC ACID, VENOUS: 1.6 mmol/L (ref 0.5–1.9)
Lactic Acid, Venous: 2.3 mmol/L (ref 0.5–1.9)

## 2016-02-24 LAB — URINALYSIS, ROUTINE W REFLEX MICROSCOPIC
Bilirubin Urine: NEGATIVE
GLUCOSE, UA: NEGATIVE mg/dL
Ketones, ur: NEGATIVE mg/dL
LEUKOCYTES UA: NEGATIVE
NITRITE: NEGATIVE
PH: 5 (ref 5.0–8.0)
PROTEIN: 100 mg/dL — AB
Specific Gravity, Urine: 1.025 (ref 1.005–1.030)

## 2016-02-24 LAB — CBC WITH DIFFERENTIAL/PLATELET
BASOS ABS: 0.1 10*3/uL (ref 0–0.1)
Basophils Relative: 1 %
EOS PCT: 2 %
Eosinophils Absolute: 0.2 10*3/uL (ref 0–0.7)
HCT: 36.4 % (ref 35.0–47.0)
Hemoglobin: 12.1 g/dL (ref 12.0–16.0)
LYMPHS ABS: 2.6 10*3/uL (ref 1.0–3.6)
Lymphocytes Relative: 25 %
MCH: 29.3 pg (ref 26.0–34.0)
MCHC: 33.2 g/dL (ref 32.0–36.0)
MCV: 88.2 fL (ref 80.0–100.0)
MONO ABS: 2 10*3/uL — AB (ref 0.2–0.9)
Monocytes Relative: 19 %
NEUTROS PCT: 53 %
Neutro Abs: 5.4 10*3/uL (ref 1.4–6.5)
PLATELETS: 223 10*3/uL (ref 150–440)
RBC: 4.12 MIL/uL (ref 3.80–5.20)
RDW: 17.2 % — AB (ref 11.5–14.5)
WBC: 10.3 10*3/uL (ref 3.6–11.0)

## 2016-02-24 LAB — COMPREHENSIVE METABOLIC PANEL
ALT: 128 U/L — ABNORMAL HIGH (ref 14–54)
ANION GAP: 6 (ref 5–15)
AST: 81 U/L — AB (ref 15–41)
Albumin: 3.5 g/dL (ref 3.5–5.0)
Alkaline Phosphatase: 70 U/L (ref 38–126)
BILIRUBIN TOTAL: 1.4 mg/dL — AB (ref 0.3–1.2)
BUN: 48 mg/dL — AB (ref 6–20)
CO2: 24 mmol/L (ref 22–32)
Calcium: 8.8 mg/dL — ABNORMAL LOW (ref 8.9–10.3)
Chloride: 121 mmol/L — ABNORMAL HIGH (ref 101–111)
Creatinine, Ser: 1.4 mg/dL — ABNORMAL HIGH (ref 0.44–1.00)
GFR calc Af Amer: 39 mL/min — ABNORMAL LOW (ref >60)
GFR, EST NON AFRICAN AMERICAN: 33 mL/min — AB (ref >60)
Glucose, Bld: 148 mg/dL — ABNORMAL HIGH (ref 65–99)
POTASSIUM: 3.8 mmol/L (ref 3.5–5.1)
Sodium: 151 mmol/L — ABNORMAL HIGH (ref 135–145)
TOTAL PROTEIN: 8.1 g/dL (ref 6.5–8.1)

## 2016-02-24 LAB — INFLUENZA PANEL BY PCR (TYPE A & B)
INFLAPCR: POSITIVE — AB
Influenza B By PCR: NEGATIVE

## 2016-02-24 LAB — SODIUM: Sodium: 147 mmol/L — ABNORMAL HIGH (ref 135–145)

## 2016-02-24 LAB — MRSA PCR SCREENING: MRSA BY PCR: NEGATIVE

## 2016-02-24 MED ORDER — OSELTAMIVIR PHOSPHATE 75 MG PO CAPS
75.0000 mg | ORAL_CAPSULE | Freq: Once | ORAL | Status: AC
  Administered 2016-02-24: 75 mg via ORAL
  Filled 2016-02-24: qty 1

## 2016-02-24 MED ORDER — LEVOTHYROXINE SODIUM 137 MCG PO TABS
137.0000 ug | ORAL_TABLET | Freq: Every evening | ORAL | Status: DC
  Administered 2016-02-24 – 2016-02-26 (×3): 137 ug via ORAL
  Filled 2016-02-24 (×4): qty 1

## 2016-02-24 MED ORDER — MONTELUKAST SODIUM 10 MG PO TABS
10.0000 mg | ORAL_TABLET | Freq: Every evening | ORAL | Status: DC
  Administered 2016-02-24 – 2016-02-26 (×3): 10 mg via ORAL
  Filled 2016-02-24 (×3): qty 1

## 2016-02-24 MED ORDER — SENNOSIDES-DOCUSATE SODIUM 8.6-50 MG PO TABS: 1.0000 | ORAL_TABLET | Freq: Every evening | ORAL | Status: DC | PRN

## 2016-02-24 MED ORDER — ONDANSETRON HCL 4 MG/2ML IJ SOLN: 4.0000 mg | Freq: Four times a day (QID) | INTRAMUSCULAR | Status: DC | PRN

## 2016-02-24 MED ORDER — TRAMADOL HCL 50 MG PO TABS: 50.0000 mg | ORAL_TABLET | Freq: Four times a day (QID) | ORAL | Status: DC | PRN

## 2016-02-24 MED ORDER — FLEET ENEMA 7-19 GM/118ML RE ENEM: 1.0000 | ENEMA | Freq: Once | RECTAL | Status: DC | PRN

## 2016-02-24 MED ORDER — ACETAMINOPHEN 650 MG RE SUPP
650.0000 mg | Freq: Four times a day (QID) | RECTAL | Status: DC | PRN
Start: 2016-02-24 — End: 2016-02-27

## 2016-02-24 MED ORDER — CEFTRIAXONE SODIUM-DEXTROSE 1-3.74 GM-% IV SOLR
1.0000 g | Freq: Once | INTRAVENOUS | Status: AC
  Administered 2016-02-24: 1 g via INTRAVENOUS
  Filled 2016-02-24 (×2): qty 50

## 2016-02-24 MED ORDER — ORAL CARE MOUTH RINSE
15.0000 mL | Freq: Two times a day (BID) | OROMUCOSAL | Status: DC
  Administered 2016-02-25 – 2016-02-26 (×3): 15 mL via OROMUCOSAL

## 2016-02-24 MED ORDER — ENOXAPARIN SODIUM 30 MG/0.3ML ~~LOC~~ SOLN
30.0000 mg | SUBCUTANEOUS | Status: DC
  Administered 2016-02-24 – 2016-02-25 (×2): 30 mg via SUBCUTANEOUS
  Filled 2016-02-24 (×2): qty 0.3

## 2016-02-24 MED ORDER — OSELTAMIVIR PHOSPHATE 75 MG PO CAPS
75.0000 mg | ORAL_CAPSULE | Freq: Two times a day (BID) | ORAL | Status: DC
  Administered 2016-02-24: 75 mg via ORAL
  Filled 2016-02-24: qty 1

## 2016-02-24 MED ORDER — CEFEPIME-DEXTROSE 2 GM/50ML IV SOLR
2.0000 g | Freq: Once | INTRAVENOUS | Status: AC
Start: 2016-02-24 — End: 2016-02-24
  Administered 2016-02-24: 2 g via INTRAVENOUS
  Filled 2016-02-24: qty 50

## 2016-02-24 MED ORDER — ONDANSETRON HCL 4 MG PO TABS: 4.0000 mg | ORAL_TABLET | Freq: Four times a day (QID) | ORAL | Status: DC | PRN

## 2016-02-24 MED ORDER — BISACODYL 5 MG PO TBEC: 5.0000 mg | DELAYED_RELEASE_TABLET | Freq: Every day | ORAL | Status: DC | PRN

## 2016-02-24 MED ORDER — DEXTROSE 5 % IV SOLN
INTRAVENOUS | Status: AC
  Administered 2016-02-24: 14:00:00 via INTRAVENOUS

## 2016-02-24 MED ORDER — ACETAMINOPHEN 325 MG PO TABS: 650.0000 mg | ORAL_TABLET | Freq: Four times a day (QID) | ORAL | Status: DC | PRN

## 2016-02-24 MED ORDER — SODIUM CHLORIDE 0.9 % IV SOLN
1000.0000 mL | INTRAVENOUS | Status: DC
  Administered 2016-02-24: 1000 mL via INTRAVENOUS

## 2016-02-24 MED ORDER — DEXTROSE 5 % IV SOLN
1.0000 g | INTRAVENOUS | Status: DC
  Administered 2016-02-25: 1 g via INTRAVENOUS
  Filled 2016-02-24 (×2): qty 10

## 2016-02-24 MED ORDER — IPRATROPIUM-ALBUTEROL 0.5-2.5 (3) MG/3ML IN SOLN
3.0000 mL | Freq: Four times a day (QID) | RESPIRATORY_TRACT | Status: DC
  Administered 2016-02-24 – 2016-02-25 (×4): 3 mL via RESPIRATORY_TRACT
  Filled 2016-02-24 (×3): qty 3

## 2016-02-24 MED ORDER — CHLORHEXIDINE GLUCONATE 0.12 % MT SOLN
15.0000 mL | Freq: Two times a day (BID) | OROMUCOSAL | Status: DC
  Administered 2016-02-24 – 2016-02-27 (×5): 15 mL via OROMUCOSAL
  Filled 2016-02-24 (×4): qty 15

## 2016-02-24 NOTE — ED Provider Notes (Signed)
Time Seen: Approximately1013  I have reviewed the triage notes  Chief Complaint: Fever   History of Present Illness: Regina Fields is a 83 y.o. female who was transported here from [facility] nursing facility for evaluation of fever, nasal congestion, dry cough. Patient has a history of dementia is unable to offer any history or review of systems.. No history of nausea, vomiting or diarrhea. Information acquired is from EMS were medical record. Patient's unable to offer any history or review of systems   Past Medical History:  Diagnosis Date  . Anxiety   . Dementia   . GERD (gastroesophageal reflux disease)   . Hypertension   . Hypothyroidism   . IBS (irritable bowel syndrome)   . Migraine   . Osteoarthritis     Patient Active Problem List   Diagnosis Date Noted  . Sepsis (HCC) 02/24/2016  . Dehydration   . Palliative care encounter   . Goals of care, counseling/discussion   . Altered mental status   . Encounter for hospice care discussion   . Hypernatremia 01/10/2016  . Healthcare-associated pneumonia 03/09/2015  . HTN (hypertension) 03/09/2015  . Dementia 03/09/2015  . Dyslipidemia 03/09/2015  . HCAP (healthcare-associated pneumonia) 03/09/2015  . Acute respiratory failure (HCC) 03/01/2015    Past Surgical History:  Procedure Laterality Date  . TONSILLECTOMY      Past Surgical History:  Procedure Laterality Date  . TONSILLECTOMY      Current Outpatient Rx  . Order #: [id number] Class: Historical Med  . Order #: [id number] Class: Print  . Order #: [id number] Class: Historical Med  . Order #: [id number] Class: No Print  . Order #: [id number] Class: Historical Med  . Order #: [id number] Class: Historical Med  . Order #: [id number] Class: Historical Med  . Order #: [id number] Class: Historical Med  . Order #: [id number] Class: Historical Med  . Order #: [id number] Class: No Print    Allergies:  Alendronate; Candesartan; Amoxicillin; Atorvastatin; Ceftriaxone;  Levofloxacin; Lisinopril; Losartan; Nizatidine; Nsaids; Omeprazole magnesium; Psyllium; Sertaconazole; and Sulfa antibiotics  Family History: No family history on file.  Social History: Social History  Substance Use Topics  . Smoking status: Never Smoker  . Smokeless tobacco: Never Used  . Alcohol use Not on file     Review of Systems:   10 point review of systems was performed and was otherwise negative:  Constitutional: No feverUntil identified by EMS Eyes: No visual disturbances ENT: No sore throat, ear pain Cardiac: No chest pain Respiratory: No shortness of breath, wheezing, or stridor Abdomen: No abdominal pain, no vomiting, No diarrhea Endocrine: No weight loss, No night sweats Extremities: No peripheral edema, cyanosis Skin: No rashes, easy bruising Neurologic: No focal weakness, trouble with speech or swollowing. No significant change in the patient's mental status Urologic: No dysuria, Hematuria, or urinary frequency   Physical Exam:  ED Triage Vitals  Enc Vitals Group     BP 02/24/16 0950 (!) 136/47     Pulse Rate 02/24/16 0950 64     Resp 02/24/16 0950 (!) 34     Temp 02/24/16 0953 (!) 102.9 F (39.4 C)     Temp Source 02/24/16 1152 Oral     SpO2 02/24/16 0950 97 %     Weight 02/24/16 0951 150 lb (68 kg)     Height 02/24/16 0951 5\' 1"  (1.549 m)     Head Circumference --      Peak Flow --      Pain Score --      Pain Loc --  Pain Edu? --      Excl. in GC? --     General: Awake , Alert , and Oriented times 1 Occasionally follows commands. Dry cough at the bedside. Head: Normal cephalic , atraumatic Eyes: Pupils equal , round, reactive to light Nose/Throat bilateral sinus drainage (clear), patent upper airway without erythema or exudate.  Neck: Supple, Full range of motion, No anterior adenopathy or palpable thyroid masses Lungs: Clear to ascultation without wheezes , rhonchi, or rales Heart: Regular rate, regular rhythm without murmurs ,  gallops , or rubs Abdomen: Soft, non tender without rebound, guarding , or rigidity; bowel sounds positive and symmetric in all 4 quadrants. No organomegaly .        Extremities: 2 plus symmetric pulses. No edema, clubbing or cyanosis Neurologic: , Motor symmetric without deficits, sensory intact Skin: warm, dry, no rashes   Labs:   All laboratory work was reviewed including any pertinent negatives or positives listed below:  Labs Reviewed  CBC WITH DIFFERENTIAL/PLATELET - Abnormal; Notable for the following:       Result Value   RDW 17.2 (*)    Monocytes Absolute 2.0 (*)    All other components within normal limits  URINALYSIS, ROUTINE W REFLEX MICROSCOPIC - Abnormal; Notable for the following:    APPearance TURBID (*)    Hgb urine dipstick LARGE (*)    Protein, ur 100 (*)    Bacteria, UA MANY (*)    Squamous Epithelial / LPF 0-5 (*)    All other components within normal limits  INFLUENZA PANEL BY PCR (TYPE A & B) - Abnormal; Notable for the following:    Influenza A By PCR POSITIVE (*)    All other components within normal limits  COMPREHENSIVE METABOLIC PANEL - Abnormal; Notable for the following:    Sodium 151 (*)    Chloride 121 (*)    Glucose, Bld 148 (*)    BUN 48 (*)    Creatinine, Ser 1.40 (*)    Calcium 8.8 (*)    AST 81 (*)    ALT 128 (*)    Total Bilirubin 1.4 (*)    GFR calc non Af Amer 33 (*)    GFR calc Af Amer 39 (*)    All other components within normal limits  CULTURE, BLOOD (ROUTINE X 2)  CULTURE, BLOOD (ROUTINE X 2)  URINE CULTURE  LACTIC ACID, PLASMA  LACTIC ACID, PLASMA  SODIUM  Flu test came back as positive and the patient also appears to be dehydrated.   Radiology:  "Dg Chest 1 View  Result Date: 02/24/2016 CLINICAL DATA:  Fever and congestion. EXAM: CHEST 1 VIEW COMPARISON:  01/13/2016 . FINDINGS: Mediastinum and hilar structures are normal. Heart size normal. Mild bibasilar subsegmental atelectasis . No pleural effusion or pneumothorax.  Plate screw fixation left humerus. IMPRESSION: Bibasilar subsegmental atelectasis. Electronically Signed   By: Maisie Fushomas  Register   On: 02/24/2016 10:31  "     I personally reviewed the radiologic studies    Critical Care: CRITICAL CARE Performed by: Jennye MoccasinBrian S Quigley   Total critical care time: 37 minutes  Critical care time was exclusive of separately billable procedures and treating other patients.  Critical care was necessary to treat or prevent imminent or life-threatening deterioration.  Critical care was time spent personally by me on the following activities: development of treatment plan with patient and/or surrogate as well as nursing, discussions with consultants, evaluation of patient's response to treatment, examination of patient, obtaining history  from patient or surrogate, ordering and performing treatments and interventions, ordering and review of laboratory studies, ordering and review of radiographic studies, pulse oximetry and re-evaluation of patient's condition.  Initiation evaluation for code sepsis.  ED Course: Patient was initiated on a code sepsis and evaluation shows at this time her heart rate and blood pressure stable. Patient was found to have influenza along with a urinary tract infection. She was initiated on IV antibiotic therapy and started on Tamiflu for influenza Clinical Course      Assessment: Acute febrile illness Influenza Urinary tract infection     Plan:  Inpatient            Jennye Moccasin, MD 02/24/16 1219

## 2016-02-24 NOTE — ED Notes (Signed)
[facility]ed [facility] to [facility].  Pt prepared for transport.

## 2016-02-24 NOTE — Progress Notes (Signed)
Per Dr. Juliene PinaMody discontinue normal saline and hang dextrose

## 2016-02-24 NOTE — Evaluation (Signed)
Physical Therapy Evaluation Patient Details Name: Regina Fields MRN: [id number] DOB: Dec 14, 1928 Today's Date: 02/24/2016   History of Present Illness  Pt is a 83 y/o F with known h/o dementia and adult failure to thrive who was last admitted in December for AKI, hypernatremia and UTI.  Was d[facility] back [facility] with hospice at the facility.  She presented to ED with fever and congestion.  She was found to have +Influenza A and was admitted for sepsis due to UTI.      Clinical Impression  Pt admitted with above diagnosis. No family members present during evaluation to provide information about PLOF or to confirm pt was a hospice patient at [facility]. Per RN, family is currently out of town.  Evaluation completed and no movement appreciated of BUEs or BLEs and pt very lethargic and moaning only.  She is likely at her baseline.  Palliative consult is pending.  PROM BUE and BLE exercises completed as pt unable to participate which pt appeared to tolerate well. Given that pt is unable to participate in therapy, PT will sign off.  Please place new PT order if pt's mobility status changes.  Moving forward the recommendation is that a lift be used if attempting OOB.      Follow Up Recommendations No PT follow up    Equipment Recommendations  Other (comment) (Hoyer lift if not returning to SNF)    Recommendations for Other Services       Precautions / Restrictions Precautions Precautions: Fall Restrictions Weight Bearing Restrictions: No      Mobility  Bed Mobility               General bed mobility comments: Unable to attempt at this time  Transfers                    Ambulation/Gait                Stairs            Wheelchair Mobility    Modified Rankin (Stroke Patients Only)       Balance                                             Pertinent Vitals/Pain Pain Assessment: Faces Faces Pain Scale: Hurts even  more Pain Location: Loud moan when PT attempts mobility at R wrist near site of IV Pain Descriptors / Indicators: Moaning Pain Intervention(s): Limited activity within patient's tolerance;Monitored during session    Home Living Family/patient expects to be discharged to:: Skilled nursing facility                      Prior Function Level of Independence: Needs assistance   Gait / Transfers Assistance Needed: Per chart review pt with hospice at SNF and unsure of pt's level of mobility.  Likely non ambulatory given evaluation today.  ADL's / Homemaking Assistance Needed: Likely needs assist for all ADLs        Hand Dominance        Extremity/Trunk Assessment   Upper Extremity Assessment Upper Extremity Assessment: RUE deficits/detail;LUE deficits/detail RUE Deficits / Details: No movement appreciated.  Pt unable to follow verbal cues or demonstration.   LUE Deficits / Details: No movement appreciated.  Pt unable to follow verbal cues or demonstration.  Limited digit  extension.    Lower Extremity Assessment Lower Extremity Assessment: RLE deficits/detail;LLE deficits/detail RLE Deficits / Details: No movement appreciated.  Pt unable to follow verbal cues or demonstration.  Limited DF to neutral. LLE Deficits / Details: No movement appreciated.  Pt unable to follow verbal cues or demonstration.  Limited DF to neutral.       Communication   Communication: Expressive difficulties;Other (comment) (pt only moaning throughout session)  Cognition Arousal/Alertness: Lethargic Behavior During Therapy: Flat affect Overall Cognitive Status: Difficult to assess                 General Comments: Pt does not answer any questions or follow any commands.  She moans only.   No family present to reports pt's cognitive status PTA.    General Comments General comments (skin integrity, edema, etc.): No family member present during evaluation to provide information on pt's PLOF or  cognitive status.  Pt received in supine with LLE ER.  Repositioned pt with towel roll ad L hip in neutral alignment.    Exercises General Exercises - Upper Extremity Shoulder Flexion: Right;Left;10 reps;Other reps (comment);Supine;PROM (2 reps LUE as pt grimacing with motion) Elbow Flexion: PROM;Both;10 reps;Supine Elbow Extension: PROM;Both;10 reps;Supine General Exercises - Lower Extremity Ankle Circles/Pumps: PROM;Both;10 reps;Supine Heel Slides: PROM;Both;5 reps;Supine Hip ABduction/ADduction: PROM;Both;5 reps;Supine Straight Leg Raises: PROM;Both;5 reps;Supine   Assessment/Plan    PT Assessment Patent does not need any further PT services  PT Problem List            PT Treatment Interventions      PT Goals (Current goals can be found in the Care Plan section)  Acute Rehab PT Goals Patient Stated Goal: unable to state PT Goal Formulation: All assessment and education complete, DC therapy    Frequency     Barriers to discharge        Co-evaluation               End of Session   Activity Tolerance: Patient limited by lethargy;Patient limited by fatigue Patient left: in bed;with call bell/phone within reach;with bed alarm set;with SCD's reapplied;Other (comment) (Prevalon cotton boots) Nurse Communication: Mobility status;Need for lift equipment         Time: 1610-9604 PT Time Calculation (min) (ACUTE ONLY): 12 min   Charges:   PT Evaluation $PT Eval Low Complexity: 1 Procedure     PT G Codes:       Encarnacion Chu PT, DPT 02/24/2016, 4:08 PM

## 2016-02-24 NOTE — Progress Notes (Signed)
ANTIBIOTIC CONSULT NOTE - INITIAL  Pharmacy Consult for Ceftriaxone  Indication: uti  Allergies  Allergen Reactions  . Alendronate Shortness Of Breath  . Candesartan Shortness Of Breath  . Amoxicillin Diarrhea  . Atorvastatin     Other reaction(s): Muscle Pain  . Ceftriaxone     Other reaction(s): Muscle Pain  . Levofloxacin     Other reaction(s): Unknown  . Lisinopril     Other reaction(s): Unknown  . Losartan     Other reaction(s): Unknown  . Nizatidine     Other reaction(s): Other (See Comments) Blurred vision  . Nsaids     Other reaction(s): Unknown  . Omeprazole Magnesium Nausea Only  . Psyllium     Other reaction(s): Unknown  . Sertaconazole     Other reaction(s): Unknown  . Sulfa Antibiotics Rash    Patient Measurements: Height: 5\' 1"  (154.9 cm) Weight: 150 lb (68 kg) IBW/kg (Calculated) : 47.8 Adjusted Body Weight:   Vital Signs: Temp: 99.2 F (37.3 C) (01/16 1255) Temp Source: Oral (01/16 1255) BP: 134/51 (01/16 1255) Pulse Rate: 64 (01/16 1255) Intake/Output from previous day: No intake/output data recorded. Intake/Output from this shift: No intake/output data recorded.  Labs:  Recent Labs  02/24/16 1012 02/24/16 1106  WBC 10.3  --   HGB 12.1  --   PLT 223  --   CREATININE  --  1.40*   Estimated Creatinine Clearance: 25.9 mL/min (by C-G formula based on SCr of 1.4 mg/dL (H)). No results for input(s): VANCOTROUGH, VANCOPEAK, VANCORANDOM, GENTTROUGH, GENTPEAK, GENTRANDOM, TOBRATROUGH, TOBRAPEAK, TOBRARND, AMIKACINPEAK, AMIKACINTROU, AMIKACIN in the last 72 hours.   Microbiology: No results found for this or any previous visit (from the past 720 hour(s)).  Medical History: Past Medical History:  Diagnosis Date  . Anxiety   . Dementia   . GERD (gastroesophageal reflux disease)   . Hypertension   . Hypothyroidism   . IBS (irritable bowel syndrome)   . Migraine   . Osteoarthritis     Medications:  Prescriptions Prior to Admission   Medication Sig Dispense Refill Last Dose  . acetaminophen (TYLENOL) 325 MG tablet Take 650 mg by mouth 3 (three) times daily as needed for mild pain.    02/23/2016 at 1657  . albuterol (PROVENTIL HFA;VENTOLIN HFA) 108 (90 Base) MCG/ACT inhaler Inhale 2 puffs into the lungs every 6 (six) hours as needed for wheezing or shortness of breath. 1 Inhaler 0 prn at prn  . aspirin 81 MG chewable tablet Chew 81 mg by mouth daily.   02/24/2016 at 0800  . bisacodyl (DULCOLAX) 10 MG suppository Place 1 suppository (10 mg total) rectally daily as needed for moderate constipation. 12 suppository 0 prn at prn  . levothyroxine (SYNTHROID, LEVOTHROID) 137 MCG tablet Take 137 mcg by mouth daily. @ 2000   02/23/2016 at 2000  . montelukast (SINGULAIR) 10 MG tablet Take 10 mg by mouth every evening.   02/23/2016 at 2000  . Multiple Vitamin (THEREMS) TABS Take 1 tablet by mouth daily.   02/24/2016 at 0800  . phenazopyridine (PYRIDIUM) 100 MG tablet Take 100 mg by mouth at bedtime as needed for pain.   prn at prn  . polyethylene glycol (MIRALAX / GLYCOLAX) packet Take 17 g by mouth at bedtime.   02/23/2016 at 2000  . ipratropium-albuterol (DUONEB) 0.5-2.5 (3) MG/3ML SOLN Take 3 mLs by nebulization every 6 (six) hours. (Patient not taking: Reported on 02/24/2016) 360 mL  Not Taking at Unknown time   Scheduled:  . [START  ON 02/25/2016] cefTRIAXone (ROCEPHIN)  IV  1 g Intravenous Q24H  . cefTRIAXone  1 g Intravenous Once  . enoxaparin (LOVENOX) injection  30 mg Subcutaneous Q24H  . ipratropium-albuterol  3 mL Nebulization Q6H  . levothyroxine  137 mcg Oral QPM  . montelukast  10 mg Oral QPM  . oseltamivir  75 mg Oral BID   Assessment: Pharmacy consulted to dose and monitor rocephin for uti  Goal of Therapy:    Plan:  Will start rocephin 1 g IV q24 hours   Alissandra Geoffroy D 02/24/2016,3:34 PM

## 2016-02-24 NOTE — Evaluation (Signed)
Clinical/Bedside Swallow Evaluation Patient Details  Name: Regina Fields MRN: [id number] Date of Birth: 06-15-1928  Today's Date: 02/24/2016 Time: SLP Start Time (ACUTE ONLY): 1330 SLP Stop Time (ACUTE ONLY): 1430 SLP Time Calculation (min) (ACUTE ONLY): 60 min  Past Medical History:  Past Medical History:  Diagnosis Date  . Anxiety   . Dementia   . GERD (gastroesophageal reflux disease)   . Hypertension   . Hypothyroidism   . IBS (irritable bowel syndrome)   . Migraine   . Osteoarthritis    Past Surgical History:  Past Surgical History:  Procedure Laterality Date  . TONSILLECTOMY     HPI:      Assessment / Plan / Recommendation Clinical Impression  Pt appears at increased risk for aspiration d/t declined Cognitive status and overt s/s of aspiration w/ trials of thin liquids. Pt appeard to exhibit throat clearing w/ cough and discoordinated control during swallowing(delayed initiation suspected). When given trials of Nectar consistency liquids, pt appeared to tolerate such via cup and straw as well as trials of puree w/ no overt s/s of aspiration noted; no decline in respiratory status following trials. Oral phase c/b adequate clearing given small tsp amounts and alternating b/t foods and liquids. Pt required feeding and verbal/tactile cues intermittently during the feeding of trials. Recommend a Dysphagia level 1 w/ NECTAR consistency liquids; strict aspiration precautions and feeding support at meals. Recommend Pills given in Puree - Crushed as able or in liquid form mixed in puree. ST services will f/u w/ toleration of diet and education on precautions and consistency of diet w/ family. Trials to upgrade diet consistency if appropriate.     Aspiration Risk  Moderate aspiration risk    Diet Recommendation  Dysphagia level 1 w/ NECTAR consistency liquids; strict aspiration precautions; feeding support at all meals. Dietary supplements at meals.   Medication Administration:  Crushed with puree    Other  Recommendations Recommended Consults:  (Dietician consult) Oral Care Recommendations: Oral care BID;Staff/trained caregiver to provide oral care Other Recommendations: Order thickener from pharmacy;Prohibited food (jello, ice cream, thin soups);Remove water pitcher;Have oral suction available   Follow up Recommendations Skilled Nursing facility      Frequency and Duration min 3x week  2 weeks       Prognosis Prognosis for Safe Diet Advancement: Fair Barriers to Reach Goals: Cognitive deficits;Severity of deficits      Swallow Study   General Date of Onset: 02/24/16 Type of Study: Bedside Swallow Evaluation Previous Swallow Assessment: none indicated Diet Prior to this Study:  (unknown) Temperature Spikes Noted: Yes (wbc 10.3) Respiratory Status: Nasal cannula (2 liters) History of Recent Intubation: No Behavior/Cognition: Confused;Distractible;Requires cueing;Doesn't follow directions (nonverbal) Oral Cavity Assessment:  (unable to assess d/t Cognitive decline/status) Oral Care Completed by SLP: No Oral Cavity - Dentition: Adequate natural dentition (appeared) Vision:  (n/a) Self-Feeding Abilities: Total assist Patient Positioning: Upright in bed Baseline Vocal Quality:  (nonverbal) Volitional Cough: Cognitively unable to elicit Volitional Swallow: Unable to elicit    Oral/Motor/Sensory Function Overall Oral Motor/Sensory Function:  (unable to assess d/t Cognitive decline/status)   Ice Chips Ice chips: Not tested   Thin Liquid Thin Liquid: Impaired Presentation: Cup (fed; 2 trials) Oral Phase Impairments:  (none) Pharyngeal  Phase Impairments: Throat Clearing - Delayed;Cough - Delayed;Suspected delayed Swallow Other Comments: noted apparent coughing w/ trials w/ CNA prior to eval    Nectar Thick Nectar Thick Liquid: Within functional limits Presentation: Cup;Straw (fed; ~3-4 ozs)   Honey Thick Honey Thick  Liquid: Not tested   Puree Puree:  Within functional limits Presentation: Spoon (fed; ~3-4 ozs total)   Solid   GO   Solid: Not tested         Jerilynn SomKatherine Watson, MS, CCC-SLP Watson,Katherine 02/24/2016,6:00 PM

## 2016-02-24 NOTE — Plan of Care (Signed)
Problem: Education: Goal: Knowledge of [facility] Education information/materials will improve Outcome: Not Progressing Pt has a history of dementia

## 2016-02-24 NOTE — Progress Notes (Signed)
Notified Dr. Juliene PinaMody of critical lactic acid. No new orders received will continue to monitor

## 2016-02-24 NOTE — H&P (Signed)
[facility] - [place] at [facility]   PATIENT NAME: Regina ServantRuby Hyder    MR#:  [id number]  DATE OF BIRTH:  Sep 27, 1928  DATE OF ADMISSION:  02/24/2016  PRIMARY CARE PHYSICIAN: No PCP Per Patient   REQUESTING/REFERRING PHYSICIAN: Dr Burnett Shenghedrick  CHIEF COMPLAINT:   Fever from [facility] HISTORY OF PRESENT ILLNESS:  Regina Fields  is a 83 y.o. female with a known history of Dementia and adult failure to thrive who was last admitted in December fro AKI, hypernatremia and klebsiella UTI only REST to AMPICILLIN and discharged back to [facility] supposedly with hospice at the facility. She presents today with fever and congestion. She has a temperature of 102.9 while in the emergency room.   She is nonverbal and bed bound at baseline PAST MEDICAL HISTORY:   Past Medical History:  Diagnosis Date  . Anxiety   . Dementia   . GERD (gastroesophageal reflux disease)   . Hypertension   . Hypothyroidism   . IBS (irritable bowel syndrome)   . Migraine   . Osteoarthritis     PAST SURGICAL HISTORY:   Past Surgical History:  Procedure Laterality Date  . TONSILLECTOMY     Thyroidectomy oh SOCIAL HISTORY:   Social History  Substance Use Topics  . Smoking status: Never Smoker  . Smokeless tobacco: Never Used  . Alcohol use Not on file    FAMILY HISTORY:  CAD, CHF and cancer  DRUG ALLERGIES:   Allergies  Allergen Reactions  . Alendronate Shortness Of Breath  . Candesartan Shortness Of Breath  . Amoxicillin Diarrhea  . Atorvastatin     Other reaction(s): Muscle Pain  . Ceftriaxone     Other reaction(s): Muscle Pain  . Levofloxacin     Other reaction(s): Unknown  . Lisinopril     Other reaction(s): Unknown  . Losartan     Other reaction(s): Unknown  . Nizatidine     Other reaction(s): Other (See Comments) Blurred vision  . Nsaids     Other reaction(s): Unknown  . Omeprazole Magnesium Nausea Only  . Psyllium     Other reaction(s): Unknown  . Sertaconazole      Other reaction(s): Unknown  . Sulfa Antibiotics Rash    REVIEW OF SYSTEMS:   Review of Systems  Unable to perform ROS: Dementia    MEDICATIONS AT HOME:   Prior to Admission medications   Medication Sig Start Date End Date Taking? Authorizing Provider  acetaminophen (TYLENOL) 325 MG tablet Take 650 mg by mouth 3 (three) times daily as needed for mild pain.     Historical Provider, MD  albuterol (PROVENTIL HFA;VENTOLIN HFA) 108 (90 Base) MCG/ACT inhaler Inhale 2 puffs into the lungs every 6 (six) hours as needed for wheezing or shortness of breath. 12/13/15   Myrna Blazeravid Matthew Schaevitz, MD  aspirin 81 MG chewable tablet Chew 81 mg by mouth daily.    Historical Provider, MD  bisacodyl (DULCOLAX) 10 MG suppository Place 1 suppository (10 mg total) rectally daily as needed for moderate constipation. 01/14/16   Shaune PollackQing Chen, MD  cephALEXin (KEFLEX) 250 MG capsule Take 1 capsule (250 mg total) by mouth every 12 (twelve) hours. 01/14/16   Shaune PollackQing Chen, MD  ipratropium-albuterol (DUONEB) 0.5-2.5 (3) MG/3ML SOLN Take 3 mLs by nebulization every 6 (six) hours. 03/13/15   Srikar Sudini, MD  levothyroxine (SYNTHROID, LEVOTHROID) 137 MCG tablet Take 137 mcg by mouth daily before breakfast.    Historical Provider, MD  montelukast (SINGULAIR) 10 MG tablet Take 10  mg by mouth every evening.    Historical Provider, MD  Multiple Vitamin (THEREMS) TABS Take 1 tablet by mouth daily.    Historical Provider, MD  polyethylene glycol (MIRALAX / GLYCOLAX) packet Take 17 g by mouth at bedtime.    Historical Provider, MD      VITAL SIGNS:  Blood pressure (!) 127/44, pulse 60, temperature (!) 102.9 F (39.4 C), resp. rate (!) 30, height 5\' 1"  (1.549 m), weight 68 kg (150 lb), SpO2 99 %.  PHYSICAL EXAMINATION:   Physical Exam  Constitutional: She is well-developed, well-nourished, and in no distress. No distress.  Ill appearing non verbal does not open eyes much.   HENT:  Head: Normocephalic.  Eyes: No scleral icterus.   Neck: Neck supple. No JVD present. No tracheal deviation present.  Cardiovascular: Normal rate, regular rhythm and normal heart sounds.  Exam reveals no gallop and no friction rub.   No murmur heard. Pulmonary/Chest: Effort normal and breath sounds normal. No respiratory distress. She has no wheezes. She has no rales. She exhibits no tenderness.  Coarse BS b/l  Abdominal: Soft. Bowel sounds are normal. She exhibits no distension and no mass. There is no tenderness. There is no rebound and no guarding.  Musculoskeletal: She exhibits edema, tenderness and deformity.  Lymphadenopathy:    She has no cervical adenopathy.  Neurological:  Lethargic state Responds to pain  Skin: Skin is warm. No rash noted. No erythema.  Psychiatric:  Non verbal       LABORATORY PANEL:   CBC  Recent Labs Lab 02/24/16 1012  WBC 10.3  HGB 12.1  HCT 36.4  PLT 223   ------------------------------------------------------------------------------------------------------------------  Chemistries   Recent Labs Lab 02/24/16 1106  NA 151*  K 3.8  CL 121*  CO2 24  GLUCOSE 148*  BUN 48*  CREATININE 1.40*  CALCIUM 8.8*  AST 81*  ALT 128*  ALKPHOS 70  BILITOT 1.4*   ------------------------------------------------------------------------------------------------------------------  Cardiac Enzymes No results for input(s): TROPONINI in the last 168 hours. ------------------------------------------------------------------------------------------------------------------  RADIOLOGY:  Dg Chest 1 View  Result Date: 02/24/2016 CLINICAL DATA:  Fever and congestion. EXAM: CHEST 1 VIEW COMPARISON:  01/13/2016 . FINDINGS: Mediastinum and hilar structures are normal. Heart size normal. Mild bibasilar subsegmental atelectasis . No pleural effusion or pneumothorax. Plate screw fixation left humerus. IMPRESSION: Bibasilar subsegmental atelectasis. Electronically Signed   By: Maisie Fus  Register   On: 02/24/2016  10:31    EKG:     IMPRESSION AND PLAN:   83 year old female with dementia from [facility]/hospice who presents with sepsis.  1. Sepsis due to UTI: Start Rocephin. Last U cx + for Klebsiella pansensitive except to AMPICILLIN Follow up on Urine cultures.  2. Influenza A: start Tamiflu for 5 days  3. Hypernatremia: from dementia and poor po intake. Start D5W Follow na levels  4. Dementia/FTT; Palliative care consult Speech eval for diet  5. AKI: due to poor po intake   Patient with decline in dementia again with hypernatremia/sepsis.      All the records are reviewed and case discussed with ED provider with plan as outlined above  CODE STATUS: DNR  TOTAL TIME TAKING CARE OF THIS PATIENT: 45 minutes.    Douglass Dunshee M.D on 02/24/2016 at 11:46 AM  Between 7am to 6pm - Pager - [phone]  After 6pm go to [email or web address] - Social research officer, government  [facility]  Office  [phone]  CC: Primary care physician; No PCP Per Patient

## 2016-02-24 NOTE — ED Triage Notes (Signed)
Pt transported [facility] with fever and  Congestion, pt nonverbal at baseline

## 2016-02-24 NOTE — Progress Notes (Signed)
Anticoagulation Monitoring:    83 yo female ordered enoxaparin 40mg  SQ Q24hr for DVT prophylaxis. Per protocol for patients with CrCl < 5430mL/min and BMI < 40, will transition patient to enoxaparin 30mg  SQ Q24hr.    Pharmacy will continue to monitor and adjust per protocol.   MLS

## 2016-02-25 LAB — BASIC METABOLIC PANEL
ANION GAP: 6 (ref 5–15)
Anion gap: 8 (ref 5–15)
BUN: 44 mg/dL — AB (ref 6–20)
BUN: 44 mg/dL — AB (ref 6–20)
CHLORIDE: 119 mmol/L — AB (ref 101–111)
CHLORIDE: 118 mmol/L — AB (ref 101–111)
CO2: 23 mmol/L (ref 22–32)
CO2: 23 mmol/L (ref 22–32)
CREATININE: 1.26 mg/dL — AB (ref 0.44–1.00)
Calcium: 8.4 mg/dL — ABNORMAL LOW (ref 8.9–10.3)
Calcium: 8.2 mg/dL — ABNORMAL LOW (ref 8.9–10.3)
Creatinine, Ser: 1.11 mg/dL — ABNORMAL HIGH (ref 0.44–1.00)
GFR calc Af Amer: 51 mL/min — ABNORMAL LOW (ref >60)
GFR calc non Af Amer: 44 mL/min — ABNORMAL LOW (ref >60)
GFR, EST AFRICAN AMERICAN: 44 mL/min — AB (ref >60)
GFR, EST NON AFRICAN AMERICAN: 38 mL/min — AB (ref >60)
Glucose, Bld: 137 mg/dL — ABNORMAL HIGH (ref 65–99)
Glucose, Bld: 164 mg/dL — ABNORMAL HIGH (ref 65–99)
POTASSIUM: 3.7 mmol/L (ref 3.5–5.1)
Potassium: 3.4 mmol/L — ABNORMAL LOW (ref 3.5–5.1)
SODIUM: 150 mmol/L — AB (ref 135–145)
SODIUM: 147 mmol/L — AB (ref 135–145)

## 2016-02-25 LAB — URINE CULTURE: CULTURE: NO GROWTH

## 2016-02-25 LAB — CBC
HCT: 33.9 % — ABNORMAL LOW (ref 35.0–47.0)
Hemoglobin: 11.1 g/dL — ABNORMAL LOW (ref 12.0–16.0)
MCH: 29.6 pg (ref 26.0–34.0)
MCHC: 32.7 g/dL (ref 32.0–36.0)
MCV: 90.4 fL (ref 80.0–100.0)
PLATELETS: 162 10*3/uL (ref 150–440)
RBC: 3.76 MIL/uL — AB (ref 3.80–5.20)
RDW: 16.8 % — AB (ref 11.5–14.5)
WBC: 8.6 10*3/uL (ref 3.6–11.0)

## 2016-02-25 MED ORDER — DEXTROSE 5 % IV SOLN
1000.0000 mL | Freq: Once | INTRAVENOUS | Status: AC
  Administered 2016-02-25: 1000 mL via INTRAVENOUS

## 2016-02-25 MED ORDER — IPRATROPIUM-ALBUTEROL 0.5-2.5 (3) MG/3ML IN SOLN
3.0000 mL | Freq: Three times a day (TID) | RESPIRATORY_TRACT | Status: DC
  Administered 2016-02-25 – 2016-02-27 (×7): 3 mL via RESPIRATORY_TRACT
  Filled 2016-02-25 (×6): qty 3

## 2016-02-25 MED ORDER — CEFTRIAXONE SODIUM-DEXTROSE 1-3.74 GM-% IV SOLR
1.0000 g | INTRAVENOUS | Status: DC
  Administered 2016-02-26: 1 g via INTRAVENOUS
  Filled 2016-02-25 (×2): qty 50

## 2016-02-25 MED ORDER — OSELTAMIVIR PHOSPHATE 30 MG PO CAPS
30.0000 mg | ORAL_CAPSULE | Freq: Every day | ORAL | Status: DC
  Administered 2016-02-25: 30 mg via ORAL
  Filled 2016-02-25: qty 1

## 2016-02-25 NOTE — Plan of Care (Signed)
Problem: Education: Goal: Knowledge of [facility] Education information/materials will improve Outcome: Not Progressing Pt has dementia   

## 2016-02-25 NOTE — Progress Notes (Signed)
PHARMACY NOTE:  ANTIMICROBIAL RENAL DOSAGE ADJUSTMENT  Current antimicrobial regimen includes a mismatch between antimicrobial dosage and estimated renal function.  Current antimicrobial dosage:  oseltamivir 75 BID  Indication: flu  Renal Function:  Estimated Creatinine Clearance: 28.8 mL/min (by C-G formula based on SCr of 1.26 mg/dL (H)).     Antimicrobial dosage has been changed to:  oseltamivir 30 daily     Thank you for allowing pharmacy to be a part of this patient's care.  Marty HeckWang, Debroh Sieloff L, [facility] LLCRPH 02/25/2016 11:10 AM

## 2016-02-25 NOTE — Progress Notes (Signed)
[facility] - [place] at [facility]   PATIENT NAME: Regina ServantRuby Fields    MR#:  [id number]  DATE OF BIRTH:  09/17/1928  SUBJECTIVE:  CHIEF COMPLAINT:  Pt is demented and altered  Pt is non verbal and bed bound at baseline  REVIEW OF SYSTEMS:  ROS unobtainable  DRUG ALLERGIES:   Allergies  Allergen Reactions  . Alendronate Shortness Of Breath  . Candesartan Shortness Of Breath  . Amoxicillin Diarrhea  . Atorvastatin     Other reaction(s): Muscle Pain  . Ceftriaxone     Other reaction(s): Muscle Pain  . Levofloxacin     Other reaction(s): Unknown  . Lisinopril     Other reaction(s): Unknown  . Losartan     Other reaction(s): Unknown  . Nizatidine     Other reaction(s): Other (See Comments) Blurred vision  . Nsaids     Other reaction(s): Unknown  . Omeprazole Magnesium Nausea Only  . Psyllium     Other reaction(s): Unknown  . Sertaconazole     Other reaction(s): Unknown  . Sulfa Antibiotics Rash    VITALS:  Blood pressure (!) 128/46, pulse (!) 58, temperature 98.4 F (36.9 C), temperature source Oral, resp. rate (!) 24, height 5\' 1"  (1.549 m), weight 68 kg (150 lb), SpO2 97 %.  PHYSICAL EXAMINATION:  GENERAL:  83 y.o.-year-old patient lying in the bed with no acute distress.  EYES: Pupils equal, round, reactive to light and accommodation. No scleral icterus.  HEENT: Head atraumatic, normocephalic. Oropharynx and nasopharynx clear.  NECK:  Supple, no jugular venous distention. No thyroid enlargement, no tenderness.  LUNGS: Normal breath sounds bilaterally, no wheezing, rales,rhonchi or crepitation. No use of accessory muscles of respiration.  CARDIOVASCULAR: S1, S2 normal. No murmurs, rubs, or gallops.  ABDOMEN: Soft, nontender, nondistended. Bowel sounds present. No organomegaly or mass.  EXTREMITIES: No pedal edema, cyanosis, or clubbing.  NEUROLOGIC: Cranial nerves II through XII are intact. Muscle strength 5/5 in all extremities. Sensation  intact. Gait not checked.  PSYCHIATRIC: The patient is alert and oriented x 3.  SKIN: No obvious rash, lesion, or ulcer.    LABORATORY PANEL:   CBC  Recent Labs Lab 02/25/16 0506  WBC 8.6  HGB 11.1*  HCT 33.9*  PLT 162   ------------------------------------------------------------------------------------------------------------------  Chemistries   Recent Labs Lab 02/24/16 1106  02/25/16 1455  NA 151*  < > 147*  K 3.8  < > 3.4*  CL 121*  < > 118*  CO2 24  < > 23  GLUCOSE 148*  < > 164*  BUN 48*  < > 44*  CREATININE 1.40*  < > 1.11*  CALCIUM 8.8*  < > 8.2*  AST 81*  --   --   ALT 128*  --   --   ALKPHOS 70  --   --   BILITOT 1.4*  --   --   < > = values in this interval not displayed. ------------------------------------------------------------------------------------------------------------------  Cardiac Enzymes No results for input(s): TROPONINI in the last 168 hours. ------------------------------------------------------------------------------------------------------------------  RADIOLOGY:  Dg Chest 1 View  Result Date: 02/24/2016 CLINICAL DATA:  Fever and congestion. EXAM: CHEST 1 VIEW COMPARISON:  01/13/2016 . FINDINGS: Mediastinum and hilar structures are normal. Heart size normal. Mild bibasilar subsegmental atelectasis . No pleural effusion or pneumothorax. Plate screw fixation left humerus. IMPRESSION: Bibasilar subsegmental atelectasis. Electronically Signed   By: Maisie Fushomas  Register   On: 02/24/2016 10:31    EKG:   Orders placed or performed during the  hospital encounter of 02/24/16  . ED EKG 12-Lead  . ED EKG 12-Lead  . EKG 12-Lead  . EKG 12-Lead    ASSESSMENT AND PLAN:   Pt with dementia brought in for fever from [facility]  # Sepsis due to UTI:  Continue  Rocephin. Last U cx + for Klebsiella pansensitive except to AMPICILLIN Follow up on Urine cultures. ST recommends puree with nectar thick liquids  2. Influenza A:  Continue Tamiflu  for 5 days  3. Hypernatremia: from dementia and poor po intake. Continue  D5W and increased to 75 ml  Follow na levels- 151-147-150   4. Dementia/FTT; Palliative care consult Speech eval for diet  5. AKI: pre renal due to poor po intake Cr - 1.4--1.2 Avoid nephrotoxins  Monitor intake and output     All the records are reviewed and case discussed with Care Management/Social Workerr. Management plans discussed with the patient, family and they are in agreement.  CODE STATUS: DNR  TOTAL TIME TAKING CARE OF THIS PATIENT:  minutes.   POSSIBLE D/C IN 2-3 DAYS, DEPENDING ON CLINICAL CONDITION.  Note: This dictation was prepared with Dragon dictation along with smaller phrase technology. Any transcriptional errors that result from this process are unintentional.   Ramonita Lab M.D on 02/25/2016 at 6:18 PM  Between 7am to 6pm - Pager - [phone] After 6pm go to [email or web address] - password [email or web address] [facility]  [facility] [place] [facility]  Office  [phone]  CC: Primary care physician; No PCP Per Patient

## 2016-02-25 NOTE — Care Management (Signed)
From [facility] with hospice care. Nonverbal. CSW updated. Please consult RNCM if needed.

## 2016-02-25 NOTE — Clinical Social Work Note (Signed)
CSW attempted to reach patient's son but had to leave a message. CSW contacted [facility] and they can take patient back at discharge as patient is a long term resident. York SpanielMonica Keili Hasten MSW,LCSW [phone]

## 2016-02-25 NOTE — Progress Notes (Signed)
Speech Language Pathology Treatment: Dysphagia  Patient Details Name: Regina RoyaltyRuby H Persad MRN: [id number] DOB: 08-04-1928 Today's Date: 02/25/2016 Time: 1020-1103 SLP Time Calculation (min) (ACUTE ONLY): 43 min  Assessment / Plan / Recommendation Clinical Impression  Pt seen for toleration of diet d/t NSG reporting oral holding w/ some foods(puree) at a previous meal. Positioned pt upright in bed; pt is nonverbal and does not follow commands. Cues given to increase attention to task of taking po's(verbal/tactile). Pt's mouth moistened w/ swabs and tsps of Nectar consistency liquids. Pt then given more tsp/cup/straw trials of Nectar liquids and purees (magic cup) w/ no overt s/s of aspiration noted except audible swallow x1. Oral phase c/b grossly wfl oral management of boluses for manipulation, transfer and swallowing. Adequate oral clearing noted b/t trials when pt was given time to nonvolitionally swallow any potential residue. Pt required tactile cues intermittently to open mouth for bolus(spoon to lips).  Pt continues to present w/ increased risk for aspiration d/t declined Cognitive status and being a dependent feeder. Recommend continue w/ current diet of Dysphagia level 1 w/ Nectar liquids; aspiration precautions; dietary supplements (Dietician consulted for such). NSG updated. ST services will continue to monitor toleration of diet but suspect this diet consistency will best benefit pt at discharge to reduce risk for aspiration and help meet nutritional needs.   HPI        SLP Plan  Continue with current plan of care     Recommendations  Diet recommendations: Dysphagia 1 (puree);Nectar-thick liquid Liquids provided via: Cup;Straw Medication Administration: Crushed with puree Supervision: Staff to assist with self feeding;Full supervision/cueing for compensatory strategies;Trained caregiver to feed patient Compensations: Minimize environmental distractions;Slow rate;Small sips/bites;Lingual  sweep for clearance of pocketing;Multiple dry swallows after each bite/sip;Follow solids with liquid Postural Changes and/or Swallow Maneuvers: Seated upright 90 degrees;Upright 30-60 min after meal                General recommendations:  (Dietician f/u) Oral Care Recommendations: Oral care BID;Staff/trained caregiver to provide oral care Follow up Recommendations: Skilled Nursing facility Plan: Continue with current plan of care       GO                Jerilynn SomKatherine Mikaia Janvier, MS, CCC-SLP Soraiya Ahner 02/25/2016, 11:04 AM

## 2016-02-25 NOTE — Progress Notes (Signed)
Okay per MD to keep fluids at same rate as order expired. Will replace order.

## 2016-02-25 NOTE — Clinical Social Work Note (Signed)
Clinical Social Work Assessment  Patient Details  Name: Regina RoyaltyRuby H Haze MRN: [id number] Date of Birth: September 10, 1928  Date of referral:  02/25/16               Reason for consult:  Facility Placement                Permission sought to share information with:    Permission granted to share information::     Name::        Agency::     Relationship::     Contact Information:     Housing/Transportation Living arrangements for the past 2 months:  Skilled Building survey[facility]ursing Facility Source of Information:   (family) Patient Interpreter Needed:  None Criminal Activity/Legal Involvement Pertinent to Current Situation/Hospitalization:  No - Comment as needed Significant Relationships:  Adult Children Lives with:  Facility Resident Do you feel safe going back to the place where you live?  Yes Need for family participation in patient care:  Yes (Comment)  Care giving concerns:  Patient resides as a [facility] term [facility].   Social Worker assessment / plan:  Patient's daughter in law returned CSW call and they stated that they do wish for patient to return to [facility] at discharge. She had no further questions or input at this time.  Employment status:  Retired Health and safety inspectornsurance information:  Medicare PT Recommendations:    Information / Referral to community resources:     Patient/Family's Response to care:  Patient's daughter in law expressed appreciation for CSW contact.  Patient/Family's Understanding of and Emotional Response to Diagnosis, Current Treatment, and Prognosis:  Patient's family aware of hospitalization and [facility] kept them informed about transfer.   Emotional Assessment Appearance:  Appears stated age Attitude/Demeanor/Rapport:   (patient sleeping ) Affect (typically observed):   (patient sleeping) Orientation:  Oriented to Self Alcohol / Substance use:  Not Applicable Psych involvement (Current and /or in the community):  No (Comment)  Discharge Needs  Concerns to be  addressed:  Care Coordination Readmission within the last 30 days:  No Current discharge risk:  None Barriers to Discharge:  No Barriers Identified   York SpanielMonica Janaisa Birkland, LCSW 02/25/2016, 12:12 PM

## 2016-02-25 NOTE — [place] FL2 (Signed)
[place] MEDICAID FL2 LEVEL OF CARE SCREENING TOOL     IDENTIFICATION  Patient Name: Regina Fields Birthdate: 1928/02/13 Sex: female Admission Date (Current Location): 02/24/2016  [facility] and [place] Number:  Chiropodist and Address:  [facility], [address], [place], [place] [address]      Provider Number: [id number]  Attending Physician Name and Address:  Ramonita Lab, MD  Relative Name and Phone Number:       Current Level of Care: Hospital Recommended Level of Care: Skilled Nursing Facility Prior Approval Number:    Date Approved/Denied:   PASRR Number:    Discharge Plan: SNF    Current Diagnoses: Patient Active Problem List   Diagnosis Date Noted  . Sepsis (HCC) 02/24/2016  . Dehydration   . Palliative care encounter   . Goals of care, counseling/discussion   . Altered mental status   . Encounter for hospice care discussion   . Hypernatremia 01/10/2016  . Healthcare-associated pneumonia 03/09/2015  . HTN (hypertension) 03/09/2015  . Dementia 03/09/2015  . Dyslipidemia 03/09/2015  . HCAP (healthcare-associated pneumonia) 03/09/2015  . Acute respiratory failure (HCC) 03/01/2015    Orientation RESPIRATION BLADDER Height & Weight     Self  Normal, O2 (3 liters) Incontinent Weight: 150 lb (68 kg) Height:  5\' 1"  (154.9 cm)  BEHAVIORAL SYMPTOMS/MOOD NEUROLOGICAL BOWEL NUTRITION STATUS   (none)  (none) Incontinent Diet  AMBULATORY STATUS COMMUNICATION OF NEEDS Skin   Total Care Verbally Normal                       Personal Care Assistance Level of Assistance  Total care       Total Care Assistance: Maximum assistance   Functional Limitations Info   (no issues)          SPECIAL CARE FACTORS FREQUENCY                       Contractures Contractures Info: Not present    Additional Factors Info  Code Status, Allergies Code Status Info: dnr             Current Medications (02/25/2016):   This is the current hospital active medication list Current Facility-Administered Medications  Medication Dose Route Frequency Provider Last Rate Last Dose  . acetaminophen (TYLENOL) tablet 650 mg  650 mg Oral Q6H PRN Adrian Saran, MD       Or  . acetaminophen (TYLENOL) suppository 650 mg  650 mg Rectal Q6H PRN Adrian Saran, MD      . bisacodyl (DULCOLAX) EC tablet 5 mg  5 mg Oral Daily PRN Adrian Saran, MD      . cefTRIAXone (ROCEPHIN) 1 g in dextrose 5 % 50 mL IVPB  1 g Intravenous Q24H Sital Mody, MD      . chlorhexidine (PERIDEX) 0.12 % solution 15 mL  15 mL Mouth Rinse BID Adrian Saran, MD   15 mL at 02/24/16 2208  . dextrose 5 % solution   Intravenous Continuous Ramonita Lab, MD 50 mL/hr at 02/24/16 1414    . enoxaparin (LOVENOX) injection 30 mg  30 mg Subcutaneous Q24H Sital Mody, MD   30 mg at 02/24/16 1414  . ipratropium-albuterol (DUONEB) 0.5-2.5 (3) MG/3ML nebulizer solution 3 mL  3 mL Nebulization Q6H Sital Mody, MD   3 mL at 02/25/16 0805  . levothyroxine (SYNTHROID, LEVOTHROID) tablet 137 mcg  137 mcg Oral QPM Adrian Saran, MD   137  mcg at 02/24/16 2007  . MEDLINE mouth rinse  15 mL Mouth Rinse q12n4p Sital Mody, MD      . montelukast (SINGULAIR) tablet 10 mg  10 mg Oral QPM Adrian SaranSital Mody, MD   10 mg at 02/24/16 1743  . ondansetron (ZOFRAN) tablet 4 mg  4 mg Oral Q6H PRN Adrian SaranSital Mody, MD       Or  . ondansetron (ZOFRAN) injection 4 mg  4 mg Intravenous Q6H PRN Adrian SaranSital Mody, MD      . oseltamivir (TAMIFLU) capsule 30 mg  30 mg Oral Daily Sital Mody, MD      . senna-docusate (Senokot-S) tablet 1 tablet  1 tablet Oral QHS PRN Adrian SaranSital Mody, MD      . sodium phosphate (FLEET) 7-19 GM/118ML enema 1 enema  1 enema Rectal Once PRN Adrian SaranSital Mody, MD      . traMADol (ULTRAM) tablet 50 mg  50 mg Oral Q6H PRN Adrian SaranSital Mody, MD         Discharge Medications: Please see discharge summary for a list of discharge medications.  Relevant Imaging Results:  Relevant Lab Results:   Additional Information     York SpanielMonica Marra, LCSW

## 2016-02-26 ENCOUNTER — Inpatient Hospital Stay: Payer: Medicare Other

## 2016-02-26 LAB — BASIC METABOLIC PANEL
Anion gap: 5 (ref 5–15)
BUN: 43 mg/dL — AB (ref 6–20)
CHLORIDE: 117 mmol/L — AB (ref 101–111)
CO2: 24 mmol/L (ref 22–32)
Calcium: 8.1 mg/dL — ABNORMAL LOW (ref 8.9–10.3)
Creatinine, Ser: 1.06 mg/dL — ABNORMAL HIGH (ref 0.44–1.00)
GFR calc Af Amer: 54 mL/min — ABNORMAL LOW (ref >60)
GFR calc non Af Amer: 47 mL/min — ABNORMAL LOW (ref >60)
Glucose, Bld: 105 mg/dL — ABNORMAL HIGH (ref 65–99)
Potassium: 3.2 mmol/L — ABNORMAL LOW (ref 3.5–5.1)
SODIUM: 146 mmol/L — AB (ref 135–145)

## 2016-02-26 MED ORDER — POLYETHYLENE GLYCOL 3350 17 G PO PACK
17.0000 g | PACK | Freq: Every day | ORAL | Status: DC
  Administered 2016-02-27: 17 g via ORAL
  Filled 2016-02-26: qty 1

## 2016-02-26 MED ORDER — OSELTAMIVIR PHOSPHATE 30 MG PO CAPS
30.0000 mg | ORAL_CAPSULE | Freq: Two times a day (BID) | ORAL | Status: DC
  Administered 2016-02-26 – 2016-02-27 (×3): 30 mg via ORAL
  Filled 2016-02-26 (×4): qty 1

## 2016-02-26 MED ORDER — ENOXAPARIN SODIUM 40 MG/0.4ML ~~LOC~~ SOLN
40.0000 mg | SUBCUTANEOUS | Status: DC
  Administered 2016-02-26: 40 mg via SUBCUTANEOUS
  Filled 2016-02-26: qty 0.4

## 2016-02-26 MED ORDER — POTASSIUM CHLORIDE CRYS ER 20 MEQ PO TBCR
40.0000 meq | EXTENDED_RELEASE_TABLET | Freq: Once | ORAL | Status: AC
  Administered 2016-02-26: 40 meq via ORAL
  Filled 2016-02-26: qty 2

## 2016-02-26 NOTE — Progress Notes (Signed)
Anticoagulation monitoring(Lovenox):  83yo  female ordered Lovenox 30 mg Q24h  Filed Weights   02/24/16 0951  Weight: 150 lb (68 kg)  Body mass index is 28.34 kg/m.    Lab Results  Component Value Date   CREATININE 1.06 (H) 02/26/2016   CREATININE 1.11 (H) 02/25/2016   CREATININE 1.26 (H) 02/25/2016   Estimated Creatinine Clearance: 34.2 mL/min (by C-G formula based on SCr of 1.06 mg/dL (H)). Hemoglobin & Hematocrit     Component Value Date/Time   HGB 11.1 (L) 02/25/2016 0506   HGB 10.7 (L) 06/22/2011 0303   HCT 33.9 (L) 02/25/2016 0506   HCT 32.1 (L) 06/22/2011 0303     Per Protocol for Patient with estCrcl > 30 ml/min and BMI < 40, will transition to Lovenox 40 mg Q24h.

## 2016-02-26 NOTE — Progress Notes (Signed)
Initial Nutrition Assessment  DOCUMENTATION CODES:   Not applicable  INTERVENTION:   Magic cup TID with meals, each supplement provides 290 kcal and 9 grams of protein  NUTRITION DIAGNOSIS:   Inadequate oral intake related to dysphagia, lethargy/confusion as evidenced by meal completion < 50%.  GOAL:   Patient will meet greater than or equal to 90% of their needs  MONITOR:   PO intake, Supplement acceptance  REASON FOR ASSESSMENT:   Low Braden    ASSESSMENT:    83 y.o. female with a known history of Dementia and adult failure to thrive who was last admitted in December fro AKI, hypernatremia and klebsiella UTI only REST to AMPICILLIN and discharged back to [facility] supposedly with hospice at the facility. She presents today with fever and congestion. Admitted for flu, UTI w/ sepsis, and AKI   Pt confused and unable to communicate, Pt's breakfast tray was about 50% eaten. Per chart, pt with wt gain. SLP eval 1/17; pt approved for DYS 1/NCT diet. Will order supplements.   Medications reviewed and include: ceftriaxone, lovenox, synthroid, tamiflu  Labs reviewed: Na 146(H), K 3.2(L), Cl 117(H), BUN 43(H), creat 1.06(H), Ca 8.1(L) AST 81(H) 1/16, ALT 128(H) 1/16, tbili 1.4(H) 1/16, lactic acid 2.3(H)  Nutrition-Focused physical exam completed. Findings are no fat depletion, no muscle depletion, and no edema.   Diet Order:  DIET - DYS 1 Room service appropriate? Yes with Assist; Fluid consistency: Nectar Thick  Skin:  Reviewed, no issues  Last BM:  none since admit  Height:   Ht Readings from Last 1 Encounters:  02/24/16 5\' 1"  (1.549 m)    Weight:   Wt Readings from Last 1 Encounters:  02/24/16 150 lb (68 kg)    Ideal Body Weight:  47.7 kg  BMI:  Body mass index is 28.34 kg/m.  Estimated Nutritional Needs:   Kcal:  1300-1600kcal/day   Protein:  68-82g/day   Fluid:  >1.3L/day   EDUCATION NEEDS:   Education needs no appropriate at this time  Betsey Holidayasey  Lyrah Bradt, RD, LDN Pager #[phone]- [phone]

## 2016-02-26 NOTE — Progress Notes (Signed)
Palliative Medicine consult noted. Due to high referral volume, there may be a delay seeing this patient. Please call the Palliative Medicine Team office at [phone] if recommendations are needed in the interim.  Thank you for inviting us to see this patient.  Margret ChanceMelanie G. Jerl Munyan, RN, BSN, [facility] 02/26/2016 8:31 AM Cell [phone] 8:00-4:00 Monday-Friday Office [phone]

## 2016-02-26 NOTE — Progress Notes (Signed)
PHARMACY NOTE:  ANTIMICROBIAL RENAL DOSAGE ADJUSTMENT  Current antimicrobial regimen includes a mismatch between antimicrobial dosage and estimated renal function.  Current antimicrobial dosage:  oseltamivir 30 daily  Indication: flu  Renal Function:  Estimated Creatinine Clearance: 34.2 mL/min (by C-G formula based on SCr of 1.06 mg/dL (H)).     Antimicrobial dosage has been changed to:  oseltamivir 30 BID for CrCl >30 ml/min     Thank you for allowing pharmacy to be a part of this patient's care.  Marty HeckWang, Teri Diltz L, [facility] Charter OakRPH 02/26/2016 9:07 AM

## 2016-02-26 NOTE — Progress Notes (Addendum)
[facility] - [place] at [facility]   PATIENT NAME: Regina Fields    MR#:  [id number]  DATE OF BIRTH:  09-13-1928  SUBJECTIVE:  CHIEF COMPLAINT:  Pt is demented and altered  Pt is non verbal and bed bound at baseline  REVIEW OF SYSTEMS:  ROS unobtainable  DRUG ALLERGIES:   Allergies  Allergen Reactions  . Alendronate Shortness Of Breath  . Candesartan Shortness Of Breath  . Amoxicillin Diarrhea  . Atorvastatin     Other reaction(s): Muscle Pain  . Ceftriaxone     Other reaction(s): Muscle Pain  . Levofloxacin     Other reaction(s): Unknown  . Lisinopril     Other reaction(s): Unknown  . Losartan     Other reaction(s): Unknown  . Nizatidine     Other reaction(s): Other (See Comments) Blurred vision  . Nsaids     Other reaction(s): Unknown  . Omeprazole Magnesium Nausea Only  . Psyllium     Other reaction(s): Unknown  . Sertaconazole     Other reaction(s): Unknown  . Sulfa Antibiotics Rash    VITALS:  Blood pressure (!) 139/49, pulse (!) 106, temperature 99.6 F (37.6 C), resp. rate 18, height 5\' 1"  (1.549 m), weight 68 kg (150 lb), SpO2 95 %.  PHYSICAL EXAMINATION:  GENERAL:  83 y.o.-year-old patient lying in the bed with no acute distress.  EYES: Pupils equal, round, reactive to light and accommodation. No scleral icterus.  HEENT: Head atraumatic, normocephalic. Oropharynx and nasopharynx clear.  NECK:  Supple, no jugular venous distention. No thyroid enlargement, no tenderness.  LUNGS: Normal breath sounds bilaterally, no wheezing, rales,rhonchi or crepitation. No use of accessory muscles of respiration.  CARDIOVASCULAR: S1, S2 normal. No murmurs, rubs, or gallops.  ABDOMEN: Soft, nontender, nondistended. Bowel sounds present. No organomegaly or mass.  EXTREMITIES: No pedal edema, cyanosis, or clubbing.  NEUROLOGIC: Cranial nerves II through XII are intact. Muscle strength 5/5 in all extremities. Sensation intact. Gait not checked.   PSYCHIATRIC: The patient is alert and oriented x 3.  SKIN: No obvious rash, lesion, or ulcer.    LABORATORY PANEL:   CBC  Recent Labs Lab 02/25/16 0506  WBC 8.6  HGB 11.1*  HCT 33.9*  PLT 162   ------------------------------------------------------------------------------------------------------------------  Chemistries   Recent Labs Lab 02/24/16 1106  02/26/16 0408  NA 151*  < > 146*  K 3.8  < > 3.2*  CL 121*  < > 117*  CO2 24  < > 24  GLUCOSE 148*  < > 105*  BUN 48*  < > 43*  CREATININE 1.40*  < > 1.06*  CALCIUM 8.8*  < > 8.1*  AST 81*  --   --   ALT 128*  --   --   ALKPHOS 70  --   --   BILITOT 1.4*  --   --   < > = values in this interval not displayed. ------------------------------------------------------------------------------------------------------------------  Cardiac Enzymes No results for input(s): TROPONINI in the last 168 hours. ------------------------------------------------------------------------------------------------------------------  RADIOLOGY:  Dg Chest 2 View  Result Date: 02/26/2016 CLINICAL DATA:  Dementia, acute kidney injury, shortness of breath, hypertension EXAM: CHEST  2 VIEW COMPARISON:  02/24/2016 FINDINGS: Normal heart size, mediastinal contours, and pulmonary vascularity. LEFT pleural effusion with bibasilar atelectasis greater on LEFT. Upper lungs clear. Central peribronchial thickening present. No pneumothorax. Bones demineralized. Prior LEFT humeral ORIF. IMPRESSION: LEFT pleural effusion with bibasilar atelectasis and central bronchitic changes. Electronically Signed   By: Angelyn Punt.D.  On: 02/26/2016 16:23    EKG:   Orders placed or performed during the hospital encounter of 02/24/16  . ED EKG 12-Lead  . ED EKG 12-Lead  . EKG 12-Lead  . EKG 12-Lead    ASSESSMENT AND PLAN:   Pt with dementia brought in for fever from [facility]  # Sepsis-Viral from influenza Urine culture negative. Blood cultures with no  growth disContinue  Rocephin. Last U cx + for Klebsiella pansensitive except to AMPICILLIN ST recommends puree with nectar thick liquids  2. Influenza A:  Continue Tamiflu30 minute dose by mouth twice a day for 5 days  3. Hypernatremia: from dementia and poor po intake. Improved with D5W   . Dementia/FTT; Palliative care consult Pending  5. AKI: pre renal due to poor po intake Improved Cr - 1.4--1.2--1.06  Avoid nephrotoxins  Monitor intake and output    pending palliative care consult   All the records are reviewed and case discussed with Care Management/Social Workerr. Management plans discussed with the patient, family and they are in agreement.  CODE STATUS: DNR  TOTAL TIME TAKING CARE OF THIS PATIENT:  minutes.   POSSIBLE D/C IN a.m.  DAYS, DEPENDING ON CLINICAL CONDITION.  Note: This dictation was prepared with Dragon dictation along with smaller phrase technology. Any transcriptional errors that result from this process are unintentional.   Ramonita LabGouru, Keyosha Tiedt M.D on 02/26/2016 at 5:37 PM  Between 7am to 6pm - Pager - [phone] After 6pm go to [email or web address] - password [email or web address] [email or web address]  [facility]  Office  [phone]  CC: Primary care physician; No PCP Per Patient

## 2016-02-27 MED ORDER — TRAMADOL HCL 50 MG PO TABS: 50.0000 mg | ORAL_TABLET | Freq: Four times a day (QID) | ORAL | 0 refills | Status: AC | PRN

## 2016-02-27 MED ORDER — ORAL CARE MOUTH RINSE: 15.0000 mL | Freq: Two times a day (BID) | OROMUCOSAL | 0 refills | Status: AC

## 2016-02-27 MED ORDER — POTASSIUM CHLORIDE CRYS ER 20 MEQ PO TBCR: 40.0000 meq | EXTENDED_RELEASE_TABLET | Freq: Once | ORAL | Status: DC

## 2016-02-27 MED ORDER — OSELTAMIVIR PHOSPHATE 30 MG PO CAPS: 30.0000 mg | ORAL_CAPSULE | Freq: Two times a day (BID) | ORAL | 0 refills | Status: DC

## 2016-02-27 NOTE — Discharge Instructions (Signed)
Follow-up with primary care physician at the facility in 2-3 days Follow-up with palliative care at the facility in 3 days

## 2016-02-27 NOTE — Care Management Important Message (Signed)
Important Message  Patient Details  Name: Tonye RoyaltyRuby H Dicioccio MRN: [id number] Date of Birth: 1928-12-08   Medicare Important Message Given:  Yes    Chapman FitchBOWEN, Akaila Rambo T, RN 02/27/2016, 12:59 PM

## 2016-02-27 NOTE — Plan of Care (Signed)
Problem: Education: Goal: Knowledge of [facility] Education information/materials will improve Outcome: Not Progressing Patient is confused and needs assistance.  Problem: Health Behavior/Discharge Planning: Goal: Ability to manage health-related needs will improve Outcome: Not Progressing Patient is confused and needs assistance.  Problem: Activity: Goal: Risk for activity intolerance will decrease Outcome: Not Progressing Patient is bed bound.  Problem: Fluid Volume: Goal: Ability to maintain a balanced intake and output will improve Outcome: Not Progressing Patient has a poor appetite with failure to thrive.  Problem: Nutrition: Goal: Adequate nutrition will be maintained Outcome: Not Progressing Poor appetite.

## 2016-02-27 NOTE — Care Management Important Message (Signed)
Important Message  Patient Details  Name: Regina Fields MRN: [id number] Date of Birth: 09/15/1928   Medicare Important Message Given:  Yes    Etoile Looman T, RN 02/27/2016, 12:59 PM 

## 2016-02-27 NOTE — Progress Notes (Addendum)
Pt to be discharged per MD order. IV removed. Instructions printed and placed in folder [facility] by [facility] CSW. Will attempt to notify family of discharge. Report called to [facility].

## 2016-02-27 NOTE — Progress Notes (Signed)
I have reviewed medical records and assessed the patient. She is alert but disoriented and not following commands. Baseline is bed bound and total care. I saw Ms. Regina Fields when hospitalized in December. Plan then was back to facility with hospice but unfortunately family never followed up and this plan fell through.   This admission, patient with urosepsis, hypernatremia, acute kidney injury, and positive for flu with underlying dementia and failure to thrive. I have left a voicemail with family to discuss hospice services at facility. If discharged today, I have placed a social work consult for palliative services to follow in hopes of transition to hospice services at facility if family agreeable.   NO CHARGE  Vennie HomansMegan Chiamaka Latka, FNP-C Palliative Medicine Team  Phone: [phone] Fax: [phone]

## 2016-02-27 NOTE — Discharge Summary (Signed)
[facility] -  at [facility]   PATIENT NAME: Regina Fields    MR#:  [id number]  DATE OF BIRTH:  Nov 12, 1928  DATE OF ADMISSION:  02/24/2016 ADMITTING PHYSICIAN: Adrian Saran, MD  DATE OF DISCHARGE:02/27/16 PRIMARY CARE PHYSICIAN: No PCP Per Patient    ADMISSION DIAGNOSIS:  Medical Eval  DISCHARGE DIAGNOSIS:  Active Problems:   Sepsis (HCC)   SECONDARY DIAGNOSIS:   Past Medical History:  Diagnosis Date  . Anxiety   . Dementia   . GERD (gastroesophageal reflux disease)   . Hypertension   . Hypothyroidism   . IBS (irritable bowel syndrome)   . Migraine   . Osteoarthritis     HOSPITAL COURSE:  Regina Fields  is a 83 y.o. female with a known history of Dementia and adult failure to thrive who was last admitted in December fro AKI, hypernatremia and klebsiella UTI only REST to AMPICILLIN and discharged back to [facility] supposedly with hospice at the facility. She presents today with fever and congestion. She has a temperature of 102.9 while in the emergency room.  HOSPITAL COURSE   # Sepsis-Viral from influenza Urine culture negative. Blood cultures with no growth disContinue  Rocephin. Last U cx + for Klebsiella pansensitive except to AMPICILLIN ST recommends puree with nectar thick liquids  2. Influenza A:  Continue Tamiflu30 minute dose by mouth twice a day for 5 days  3. Hypernatremia: from dementia and poor po intake. Improved with D5W   . Dementia/FTT; Palliative care consult Pending  5. AKI: pre renal due to poor po intake Improved Cr - 1.4--1.2--1.06  Avoid nephrotoxins  Monitor intake and output    pending palliative care consult -palliative care called and left a message to the family and awaiting call back  Disposition skilled nursing care with palliative care follow-up     DISCHARGE CONDITIONS:   FAIR  CONSULTS OBTAINED:     PROCEDURES  None   DRUG ALLERGIES:   Allergies  Allergen Reactions  .  Alendronate Shortness Of Breath  . Candesartan Shortness Of Breath  . Amoxicillin Diarrhea  . Atorvastatin     Other reaction(s): Muscle Pain  . Ceftriaxone     Other reaction(s): Muscle Pain  . Levofloxacin     Other reaction(s): Unknown  . Lisinopril     Other reaction(s): Unknown  . Losartan     Other reaction(s): Unknown  . Nizatidine     Other reaction(s): Other (See Comments) Blurred vision  . Nsaids     Other reaction(s): Unknown  . Omeprazole Magnesium Nausea Only  . Psyllium     Other reaction(s): Unknown  . Sertaconazole     Other reaction(s): Unknown  . Sulfa Antibiotics Rash    DISCHARGE MEDICATIONS:   Current Discharge Medication List    START taking these medications   Details  mouth rinse LIQD solution 15 mLs by Mouth Rinse route 2 times daily at 12 noon and 4 pm. Qty: 118 mL, Refills: 0    oseltamivir (TAMIFLU) 30 MG capsule Take 1 capsule (30 mg total) by mouth 2 (two) times daily. Qty: 3 capsule, Refills: 0    traMADol (ULTRAM) 50 MG tablet Take 1 tablet (50 mg total) by mouth every 6 (six) hours as needed for moderate pain. Qty: 30 tablet, Refills: 0      CONTINUE these medications which have NOT CHANGED   Details  acetaminophen (TYLENOL) 325 MG tablet Take 650 mg by mouth 3 (three) times daily as needed  for mild pain.     albuterol (PROVENTIL HFA;VENTOLIN HFA) 108 (90 Base) MCG/ACT inhaler Inhale 2 puffs into the lungs every 6 (six) hours as needed for wheezing or shortness of breath. Qty: 1 Inhaler, Refills: 0    aspirin 81 MG chewable tablet Chew 81 mg by mouth daily.    bisacodyl (DULCOLAX) 10 MG suppository Place 1 suppository (10 mg total) rectally daily as needed for moderate constipation. Qty: 12 suppository, Refills: 0    levothyroxine (SYNTHROID, LEVOTHROID) 137 MCG tablet Take 137 mcg by mouth daily. @ 2000    montelukast (SINGULAIR) 10 MG tablet Take 10 mg by mouth every evening.    Multiple Vitamin (THEREMS) TABS Take 1 tablet  by mouth daily.    phenazopyridine (PYRIDIUM) 100 MG tablet Take 100 mg by mouth at bedtime as needed for pain.    polyethylene glycol (MIRALAX / GLYCOLAX) packet Take 17 g by mouth at bedtime.    ipratropium-albuterol (DUONEB) 0.5-2.5 (3) MG/3ML SOLN Take 3 mLs by nebulization every 6 (six) hours. Qty: 360 mL         DISCHARGE INSTRUCTIONS:  Follow-up with primary care physician at the facility in 2-3 days Follow-up with palliative care at the facility in 3 days   DIET:  Pure diet with nectar thick liquids  DISCHARGE CONDITION:  Fair  ACTIVITY:  Activity as tolerated  OXYGEN:  Home Oxygen: Yes.     Oxygen Delivery: 2 liters/min via Patient connected to nasal cannula oxygen  DISCHARGE LOCATION:  nursing home   If you experience worsening of your admission symptoms, develop shortness of breath, life threatening emergency, suicidal or homicidal thoughts you must seek medical attention immediately by calling 911 or calling your MD immediately  if symptoms less severe.  You Must read complete instructions/literature along with all the possible adverse reactions/side effects for all the Medicines you take and that have been prescribed to you. Take any new Medicines after you have completely understood and accpet all the possible adverse reactions/side effects.   Please note  You were cared for by a hospitalist during your hospital stay. If you have any questions about your discharge medications or the care you received while you were in the hospital after you are discharged, you can call the unit and asked to speak with the hospitalist on call if the hospitalist that took care of you is not available. Once you are discharged, your primary care physician will handle any further medical issues. Please note that NO REFILLS for any discharge medications will be authorized once you are discharged, as it is imperative that you return to your primary care physician (or establish a  relationship with a primary care physician if you do not have one) for your aftercare needs so that they can reassess your need for medications and monitor your lab values.     Today  Chief Complaint  Patient presents with  . Fever   Patient is chronically demented poor historian  ROS:  unobtainable   VITAL SIGNS:  Blood pressure 123/62, pulse (!) 114, temperature 98.7 F (37.1 C), resp. rate 15, height 5\' 1"  (1.549 m), weight 68 kg (150 lb), SpO2 95 %.  I/O:    Intake/Output Summary (Last 24 hours) at 02/27/16 1304 Last data filed at 02/27/16 0800  Gross per 24 hour  Intake              320 ml  Output  0 ml  Net              320 ml    PHYSICAL EXAMINATION:  GENERAL:  83 y.o.-year-old patient lying in the bed with no acute distress.  EYES: Pupils equal, round, reactive to light and accommodation. No scleral icterus. Extraocular muscles intact.  HEENT: Head atraumatic, normocephalic. Oropharynx and nasopharynx clear.  NECK:  Supple, no jugular venous distention. No thyroid enlargement, no tenderness.  LUNGS: mod breath sounds bilaterally, no wheezing, rales,rhonchi or crepitation. No use of accessory muscles of respiration.  CARDIOVASCULAR: S1, S2 normal. No murmurs, rubs, or gallops.  ABDOMEN: Soft, non-tender, non-distended. Bowel sounds present. No organomegaly or mass.  EXTREMITIES: No pedal edema, cyanosis, or clubbing.  NEUROLOGIC: Patient is chronically demented and disoriented. Gait not checked.  PSYCHIATRIC: The patient is alert and disoriented from dementia SKIN: No obvious rash, lesion, or ulcer.   DATA REVIEW:   CBC  Recent Labs Lab 02/25/16 0506  WBC 8.6  HGB 11.1*  HCT 33.9*  PLT 162    Chemistries   Recent Labs Lab 02/24/16 1106  02/26/16 0408  NA 151*  < > 146*  K 3.8  < > 3.2*  CL 121*  < > 117*  CO2 24  < > 24  GLUCOSE 148*  < > 105*  BUN 48*  < > 43*  CREATININE 1.40*  < > 1.06*  CALCIUM 8.8*  < > 8.1*  AST 81*  --    --   ALT 128*  --   --   ALKPHOS 70  --   --   BILITOT 1.4*  --   --   < > = values in this interval not displayed.  Cardiac Enzymes No results for input(s): TROPONINI in the last 168 hours.  Microbiology Results  Results for orders placed or performed during the hospital encounter of 02/24/16  Blood Culture (routine x 2)     Status: None (Preliminary result)   Collection Time: 02/24/16 10:12 AM  Result Value Ref Range Status   Specimen Description BLOOD L FA  Final   Special Requests   Final    BOTTLES DRAWN AEROBIC AND ANAEROBIC AER 7ML ANA 5ML   Culture NO GROWTH 3 DAYS  Final   Report Status PENDING  Incomplete  Blood Culture (routine x 2)     Status: None (Preliminary result)   Collection Time: 02/24/16 10:13 AM  Result Value Ref Range Status   Specimen Description BLOOD R HAND  Final   Special Requests   Final    BOTTLES DRAWN AEROBIC AND ANAEROBIC AER 3ML ANA 5ML   Culture NO GROWTH 3 DAYS  Final   Report Status PENDING  Incomplete  Urine culture     Status: None   Collection Time: 02/24/16 10:13 AM  Result Value Ref Range Status   Specimen Description URINE, RANDOM  Final   Special Requests NONE  Final   Culture   Final    NO GROWTH Performed at [facility], [address] [place], [place] [address]    Report Status 02/25/2016 FINAL  Final  MRSA PCR Screening     Status: None   Collection Time: 02/24/16  3:21 PM  Result Value Ref Range Status   MRSA by PCR NEGATIVE NEGATIVE Final    Comment:        The GeneXpert MRSA Assay (FDA approved for NASAL specimens only), is one component of a comprehensive MRSA colonization surveillance program. It is  not intended to diagnose MRSA infection nor to guide or monitor treatment for MRSA infections.     RADIOLOGY:  Dg Chest 1 View  Result Date: 02/24/2016 CLINICAL DATA:  Fever and congestion. EXAM: CHEST 1 VIEW COMPARISON:  01/13/2016 . FINDINGS: Mediastinum and hilar structures are normal. Heart size  normal. Mild bibasilar subsegmental atelectasis . No pleural effusion or pneumothorax. Plate screw fixation left humerus. IMPRESSION: Bibasilar subsegmental atelectasis. Electronically Signed   By: Maisie Fus  Register   On: 02/24/2016 10:31   Dg Chest 2 View  Result Date: 02/26/2016 CLINICAL DATA:  Dementia, acute kidney injury, shortness of breath, hypertension EXAM: CHEST  2 VIEW COMPARISON:  02/24/2016 FINDINGS: Normal heart size, mediastinal contours, and pulmonary vascularity. LEFT pleural effusion with bibasilar atelectasis greater on LEFT. Upper lungs clear. Central peribronchial thickening present. No pneumothorax. Bones demineralized. Prior LEFT humeral ORIF. IMPRESSION: LEFT pleural effusion with bibasilar atelectasis and central bronchitic changes. Electronically Signed   By: Ulyses Southward M.D.   On: 02/26/2016 16:23    EKG:   Orders placed or performed during the hospital encounter of 02/24/16  . ED EKG 12-Lead  . ED EKG 12-Lead  . EKG 12-Lead  . EKG 12-Lead      Management plans discussed with the patient, family and they are in agreement.  CODE STATUS:     Code Status Orders        Start     Ordered   02/24/16 1012  Do not attempt resuscitation (DNR)  Continuous    Question Answer Comment  In the event of cardiac or respiratory ARREST Do not call a "code blue"   In the event of cardiac or respiratory ARREST Do not perform Intubation, CPR, defibrillation or ACLS   In the event of cardiac or respiratory ARREST Use medication by any route, position, wound care, and other measures to relive pain and suffering. May use oxygen, suction and manual treatment of airway obstruction as needed for comfort.      02/24/16 1011    Code Status History    Date Active Date Inactive Code Status Order ID Comments User Context   01/11/2016 12:33 PM 01/14/2016  4:21 PM DNR [id number]  Shaune Pollack, MD Inpatient   01/10/2016  1:23 PM 01/11/2016 12:33 PM Full Code [id number]  Milagros Loll, MD ED    03/10/2015 12:16 AM 03/13/2015  7:28 PM Full Code [id number]  Gery Pray, MD ED   03/01/2015  6:51 PM 03/05/2015  4:42 PM Full Code [id number]  Katha Hamming, MD ED    Advance Directive Documentation   Flowsheet Row Most Recent Value  Type of Advance Directive  Healthcare Power of Attorney  Pre-existing out of facility DNR order (yellow form or pink MOST form)  No data  "MOST" Form in Place?  No data      TOTAL TIME TAKING CARE OF THIS PATIENT: 45  minutes.   Note: This dictation was prepared with Dragon dictation along with smaller phrase technology. Any transcriptional errors that result from this process are unintentional.   @M[facility] @  on 02/27/2016 at 1:04 PM  Between 7am to 6pm - Pager - [phone]  After 6pm go to [email or web address] - password [email or web address] [facility]  [facility]  Office  [phone]  CC: Primary care physician; No PCP Per Patient

## 2016-02-28 ENCOUNTER — Emergency Department: Payer: Medicare Other

## 2016-02-28 ENCOUNTER — Inpatient Hospital Stay
Admission: EM | Admit: 2016-02-28 | Discharge: 2016-03-02 | DRG: 871 | Disposition: A | Discharge status: Skilled Nursing Facility | Payer: Medicare Other | Attending: Internal Medicine | Admitting: Internal Medicine

## 2016-02-28 ENCOUNTER — Encounter: Payer: Self-pay | Admitting: Emergency Medicine

## 2016-02-28 DIAGNOSIS — L89159 Pressure ulcer of sacral region, unspecified stage: Secondary | ICD-10-CM | POA: Diagnosis present

## 2016-02-28 DIAGNOSIS — J111 Influenza due to unidentified influenza virus with other respiratory manifestations: Secondary | ICD-10-CM | POA: Diagnosis present

## 2016-02-28 DIAGNOSIS — Z888 Allergy status to other drugs, medicaments and biological substances status: Secondary | ICD-10-CM | POA: Diagnosis not present

## 2016-02-28 DIAGNOSIS — Z79899 Other long term (current) drug therapy: Secondary | ICD-10-CM | POA: Diagnosis not present

## 2016-02-28 DIAGNOSIS — Z515 Encounter for palliative care: Secondary | ICD-10-CM

## 2016-02-28 DIAGNOSIS — R627 Adult failure to thrive: Secondary | ICD-10-CM | POA: Diagnosis present

## 2016-02-28 DIAGNOSIS — Z880 Allergy status to penicillin: Secondary | ICD-10-CM

## 2016-02-28 DIAGNOSIS — R6510 Systemic inflammatory response syndrome (SIRS) of non-infectious origin without acute organ dysfunction: Secondary | ICD-10-CM | POA: Diagnosis not present

## 2016-02-28 DIAGNOSIS — F0390 Unspecified dementia without behavioral disturbance: Secondary | ICD-10-CM | POA: Diagnosis present

## 2016-02-28 DIAGNOSIS — Z886 Allergy status to analgesic agent status: Secondary | ICD-10-CM | POA: Diagnosis not present

## 2016-02-28 DIAGNOSIS — I10 Essential (primary) hypertension: Secondary | ICD-10-CM | POA: Diagnosis present

## 2016-02-28 DIAGNOSIS — Z882 Allergy status to sulfonamides status: Secondary | ICD-10-CM | POA: Diagnosis not present

## 2016-02-28 DIAGNOSIS — A419 Sepsis, unspecified organism: Secondary | ICD-10-CM | POA: Diagnosis not present

## 2016-02-28 DIAGNOSIS — Z7982 Long term (current) use of aspirin: Secondary | ICD-10-CM

## 2016-02-28 DIAGNOSIS — L8990 Pressure ulcer of unspecified site, unspecified stage: Secondary | ICD-10-CM | POA: Insufficient documentation

## 2016-02-28 DIAGNOSIS — G9340 Encephalopathy, unspecified: Secondary | ICD-10-CM | POA: Diagnosis present

## 2016-02-28 DIAGNOSIS — E440 Moderate protein-calorie malnutrition: Secondary | ICD-10-CM | POA: Insufficient documentation

## 2016-02-28 DIAGNOSIS — M6281 Muscle weakness (generalized): Secondary | ICD-10-CM

## 2016-02-28 DIAGNOSIS — Z66 Do not resuscitate: Secondary | ICD-10-CM | POA: Diagnosis present

## 2016-02-28 DIAGNOSIS — N390 Urinary tract infection, site not specified: Secondary | ICD-10-CM | POA: Diagnosis present

## 2016-02-28 DIAGNOSIS — Z6828 Body mass index (BMI) 28.0-28.9, adult: Secondary | ICD-10-CM

## 2016-02-28 DIAGNOSIS — F0391 Unspecified dementia with behavioral disturbance: Secondary | ICD-10-CM | POA: Diagnosis not present

## 2016-02-28 DIAGNOSIS — R4182 Altered mental status, unspecified: Secondary | ICD-10-CM

## 2016-02-28 DIAGNOSIS — R262 Difficulty in walking, not elsewhere classified: Secondary | ICD-10-CM

## 2016-02-28 LAB — TROPONIN I

## 2016-02-28 LAB — COMPREHENSIVE METABOLIC PANEL
ALT: 76 U/L — AB (ref 14–54)
AST: 50 U/L — AB (ref 15–41)
Albumin: 3.5 g/dL (ref 3.5–5.0)
Alkaline Phosphatase: 65 U/L (ref 38–126)
Anion gap: 7 (ref 5–15)
BILIRUBIN TOTAL: 1 mg/dL (ref 0.3–1.2)
BUN: 38 mg/dL — ABNORMAL HIGH (ref 6–20)
CALCIUM: 9.1 mg/dL (ref 8.9–10.3)
CHLORIDE: 122 mmol/L — AB (ref 101–111)
CO2: 22 mmol/L (ref 22–32)
Creatinine, Ser: 1 mg/dL (ref 0.44–1.00)
GFR, EST AFRICAN AMERICAN: 58 mL/min — AB (ref >60)
GFR, EST NON AFRICAN AMERICAN: 50 mL/min — AB (ref >60)
Glucose, Bld: 105 mg/dL — ABNORMAL HIGH (ref 65–99)
Potassium: 4.5 mmol/L (ref 3.5–5.1)
Sodium: 151 mmol/L — ABNORMAL HIGH (ref 135–145)
Total Protein: 7.9 g/dL (ref 6.5–8.1)

## 2016-02-28 LAB — URINALYSIS, ROUTINE W REFLEX MICROSCOPIC
BILIRUBIN URINE: NEGATIVE
Glucose, UA: NEGATIVE mg/dL
Ketones, ur: NEGATIVE mg/dL
LEUKOCYTES UA: NEGATIVE
Nitrite: POSITIVE — AB
PROTEIN: 100 mg/dL — AB
SPECIFIC GRAVITY, URINE: 1.024 (ref 1.005–1.030)
pH: 5 (ref 5.0–8.0)

## 2016-02-28 LAB — CBC WITH DIFFERENTIAL/PLATELET
BASOS ABS: 0.1 10*3/uL (ref 0–0.1)
Band Neutrophils: 2 %
Basophils Relative: 1 %
Blasts: 0 %
EOS PCT: 5 %
Eosinophils Absolute: 0.4 10*3/uL (ref 0–0.7)
HCT: 33.6 % — ABNORMAL LOW (ref 35.0–47.0)
HEMOGLOBIN: 11 g/dL — AB (ref 12.0–16.0)
LYMPHS ABS: 2.6 10*3/uL (ref 1.0–3.6)
Lymphocytes Relative: 32 %
MCH: 28.8 pg (ref 26.0–34.0)
MCHC: 32.8 g/dL (ref 32.0–36.0)
MCV: 88 fL (ref 80.0–100.0)
METAMYELOCYTES PCT: 3 %
MONO ABS: 0.4 10*3/uL (ref 0.2–0.9)
MYELOCYTES: 3 %
Monocytes Relative: 5 %
NEUTROS PCT: 49 %
Neutro Abs: 4.7 10*3/uL (ref 1.4–6.5)
Other: 0 %
PROMYELOCYTES ABS: 0 %
Platelets: 153 10*3/uL (ref 150–440)
RBC: 3.82 MIL/uL (ref 3.80–5.20)
RDW: 17.3 % — AB (ref 11.5–14.5)
WBC: 8.2 10*3/uL (ref 3.6–11.0)
nRBC: 0 /100 WBC

## 2016-02-28 LAB — LACTIC ACID, PLASMA: LACTIC ACID, VENOUS: 1.5 mmol/L (ref 0.5–1.9)

## 2016-02-28 MED ORDER — ACETAMINOPHEN 650 MG RE SUPP
RECTAL | Status: AC
  Administered 2016-02-28: 650 mg via RECTAL
  Filled 2016-02-28: qty 1

## 2016-02-28 MED ORDER — OSELTAMIVIR PHOSPHATE 30 MG PO CAPS
30.0000 mg | ORAL_CAPSULE | Freq: Two times a day (BID) | ORAL | Status: DC
  Administered 2016-03-01 – 2016-03-02 (×3): 30 mg via ORAL
  Filled 2016-02-28 (×3): qty 1

## 2016-02-28 MED ORDER — DEXTROSE 5 % IV SOLN
INTRAVENOUS | Status: AC
  Administered 2016-02-28: 500 mg via INTRAVENOUS
  Filled 2016-02-28: qty 500

## 2016-02-28 MED ORDER — IPRATROPIUM-ALBUTEROL 0.5-2.5 (3) MG/3ML IN SOLN
RESPIRATORY_TRACT | Status: AC
  Administered 2016-02-28: 3 mL via RESPIRATORY_TRACT
  Filled 2016-02-28: qty 3

## 2016-02-28 MED ORDER — ONDANSETRON HCL 4 MG/2ML IJ SOLN: 4.0000 mg | Freq: Four times a day (QID) | INTRAMUSCULAR | Status: DC | PRN

## 2016-02-28 MED ORDER — IPRATROPIUM-ALBUTEROL 0.5-2.5 (3) MG/3ML IN SOLN
3.0000 mL | Freq: Four times a day (QID) | RESPIRATORY_TRACT | Status: DC
  Administered 2016-02-28 – 2016-02-29 (×3): 3 mL via RESPIRATORY_TRACT
  Filled 2016-02-28 (×2): qty 3

## 2016-02-28 MED ORDER — SODIUM CHLORIDE 0.45 % IV SOLN
INTRAVENOUS | Status: DC
  Administered 2016-02-28 – 2016-03-01 (×4): via INTRAVENOUS

## 2016-02-28 MED ORDER — BISACODYL 10 MG RE SUPP
10.0000 mg | Freq: Every day | RECTAL | Status: DC | PRN
  Filled 2016-02-28: qty 1

## 2016-02-28 MED ORDER — IPRATROPIUM-ALBUTEROL 0.5-2.5 (3) MG/3ML IN SOLN: 3.0000 mL | Freq: Four times a day (QID) | RESPIRATORY_TRACT | Status: DC | PRN

## 2016-02-28 MED ORDER — AZITHROMYCIN 500 MG IV SOLR
500.0000 mg | INTRAVENOUS | Status: DC
  Administered 2016-02-28 – 2016-02-29 (×2): 500 mg via INTRAVENOUS
  Filled 2016-02-28 (×2): qty 500

## 2016-02-28 MED ORDER — ALBUTEROL SULFATE HFA 108 (90 BASE) MCG/ACT IN AERS: 2.0000 | INHALATION_SPRAY | Freq: Four times a day (QID) | RESPIRATORY_TRACT | Status: DC | PRN

## 2016-02-28 MED ORDER — DOCUSATE SODIUM 100 MG PO CAPS
100.0000 mg | ORAL_CAPSULE | Freq: Two times a day (BID) | ORAL | Status: DC
  Administered 2016-03-01 – 2016-03-02 (×3): 100 mg via ORAL
  Filled 2016-02-28 (×3): qty 1

## 2016-02-28 MED ORDER — BISACODYL 10 MG RE SUPP
10.0000 mg | Freq: Every day | RECTAL | Status: DC | PRN
  Administered 2016-03-01: 19:00:00 10 mg via RECTAL

## 2016-02-28 MED ORDER — OSELTAMIVIR PHOSPHATE 30 MG PO CAPS
30.0000 mg | ORAL_CAPSULE | Freq: Two times a day (BID) | ORAL | Status: DC
  Filled 2016-02-28: qty 1

## 2016-02-28 MED ORDER — HEPARIN SODIUM (PORCINE) 5000 UNIT/ML IJ SOLN
5000.0000 [IU] | Freq: Three times a day (TID) | INTRAMUSCULAR | Status: DC
Start: 2016-02-28 — End: 2016-02-29
  Administered 2016-02-28 – 2016-02-29 (×3): 5000 [IU] via SUBCUTANEOUS
  Filled 2016-02-28 (×3): qty 1

## 2016-02-28 MED ORDER — DEXTROSE 5 % IV SOLN: 1.0000 g | Freq: Once | INTRAVENOUS | Status: DC

## 2016-02-28 MED ORDER — ACETAMINOPHEN 650 MG RE SUPP: 650.0000 mg | Freq: Four times a day (QID) | RECTAL | Status: DC | PRN

## 2016-02-28 MED ORDER — ASPIRIN 81 MG PO CHEW
81.0000 mg | CHEWABLE_TABLET | Freq: Every day | ORAL | Status: DC
  Administered 2016-03-01 – 2016-03-02 (×2): 81 mg via ORAL
  Filled 2016-02-28 (×2): qty 1

## 2016-02-28 MED ORDER — CEFTRIAXONE SODIUM-DEXTROSE 1-3.74 GM-% IV SOLR
1.0000 g | INTRAVENOUS | Status: DC
  Administered 2016-02-29: 17:00:00 1 g via INTRAVENOUS
  Filled 2016-02-28: qty 50

## 2016-02-28 MED ORDER — ORAL CARE MOUTH RINSE
15.0000 mL | Freq: Two times a day (BID) | OROMUCOSAL | Status: DC
  Administered 2016-02-29 – 2016-03-02 (×5): 15 mL via OROMUCOSAL

## 2016-02-28 MED ORDER — NITROFURANTOIN MONOHYD MACRO 100 MG PO CAPS: 100.0000 mg | ORAL_CAPSULE | Freq: Two times a day (BID) | ORAL | Status: DC

## 2016-02-28 MED ORDER — ACETAMINOPHEN 650 MG RE SUPP
650.0000 mg | Freq: Once | RECTAL | Status: AC
  Administered 2016-02-28: 650 mg via RECTAL

## 2016-02-28 MED ORDER — ONDANSETRON HCL 4 MG PO TABS: 4.0000 mg | ORAL_TABLET | Freq: Four times a day (QID) | ORAL | Status: DC | PRN

## 2016-02-28 MED ORDER — MORPHINE SULFATE (PF) 2 MG/ML IV SOLN: 2.0000 mg | INTRAVENOUS | Status: DC | PRN

## 2016-02-28 MED ORDER — CEFTRIAXONE SODIUM 1 G IJ SOLR: 1.0000 g | INTRAMUSCULAR | Status: DC

## 2016-02-28 MED ORDER — LEVOTHYROXINE SODIUM 137 MCG PO TABS
137.0000 ug | ORAL_TABLET | Freq: Every day | ORAL | Status: DC
  Administered 2016-03-01 – 2016-03-02 (×2): 137 ug via ORAL
  Filled 2016-02-28 (×2): qty 1

## 2016-02-28 MED ORDER — ACETAMINOPHEN 325 MG PO TABS: 650.0000 mg | ORAL_TABLET | Freq: Four times a day (QID) | ORAL | Status: DC | PRN

## 2016-02-28 MED ORDER — CEFTRIAXONE SODIUM-DEXTROSE 1-3.74 GM-% IV SOLR
1.0000 g | Freq: Once | INTRAVENOUS | Status: AC
  Administered 2016-02-28: 1 g via INTRAVENOUS
  Filled 2016-02-28: qty 50

## 2016-02-28 NOTE — ED Triage Notes (Signed)
Patient from [facility]. Staff reports patient has had increased wheezing and crackles since yesterday. Patient was seen in ED yesterday and diagnosed with the flu. Patient has audible wheezing and crackles upon arrival. Oriented at baseline, history of dementia.

## 2016-02-28 NOTE — ED Provider Notes (Signed)
[facility] Emergency Department Provider Note  Time seen: 2:05 PM  I have reviewed the triage vital signs and the nursing notes.   HISTORY  Chief Complaint Shortness of Breath and Influenza    HPI Regina Fields is a 83 y.o. female with a past medical history of dementia and, hypertension, presents to the emergency department with fever, weakness and audible rhonchi. According to record review and EMS the patient was discharged from the hospital yesterday after a 3 night stay for sepsis thought to be secondary to influenza A which was positive on 02/24/16. Nursing home staff states today the patient has been more weak with apparent difficulty breathing and audible wet lung sounds to the patient was sent back to the emergency department for evaluation. Upon arrival the patient is febrile to 100.4, and tachycardic with audible rhonchi on exam.Patient has significant dementia and cannot contribute to her history.  Past Medical History:  Diagnosis Date  . Anxiety   . Dementia   . GERD (gastroesophageal reflux disease)   . Hypertension   . Hypothyroidism   . IBS (irritable bowel syndrome)   . Migraine   . Osteoarthritis     Patient Active Problem List   Diagnosis Date Noted  . Sepsis ([place]) 02/24/2016  . Dehydration   . Palliative care encounter   . Goals of care, counseling/discussion   . Altered mental status   . Encounter for hospice care discussion   . Hypernatremia 01/10/2016  . Healthcare-associated pneumonia 03/09/2015  . HTN (hypertension) 03/09/2015  . Dementia 03/09/2015  . Dyslipidemia 03/09/2015  . HCAP (healthcare-associated pneumonia) 03/09/2015  . Acute respiratory failure ([place]) 03/01/2015    Past Surgical History:  Procedure Laterality Date  . TONSILLECTOMY      Prior to Admission medications   Medication Sig Start Date End Date Taking? Authorizing Provider  acetaminophen (TYLENOL) 325 MG tablet Take 650 mg by mouth 3 (three) times  daily as needed for mild pain.    Yes Historical Provider, MD  albuterol (PROVENTIL HFA;VENTOLIN HFA) 108 (90 Base) MCG/ACT inhaler Inhale 2 puffs into the lungs every 6 (six) hours as needed for wheezing or shortness of breath. 12/13/15  Yes Orbie Pyo, MD  aspirin 81 MG chewable tablet Chew 81 mg by mouth daily.   Yes Historical Provider, MD  bisacodyl (DULCOLAX) 10 MG suppository Place 1 suppository (10 mg total) rectally daily as needed for moderate constipation. 01/14/16  Yes Demetrios Loll, MD  ipratropium-albuterol (DUONEB) 0.5-2.5 (3) MG/3ML SOLN Take 3 mLs by nebulization every 6 (six) hours. 03/13/15  Yes Srikar Sudini, MD  levothyroxine (SYNTHROID, LEVOTHROID) 137 MCG tablet Take 137 mcg by mouth daily. @ 2000   Yes Historical Provider, MD  montelukast (SINGULAIR) 10 MG tablet Take 10 mg by mouth every evening.   Yes Historical Provider, MD  mouth rinse LIQD solution 15 mLs by Mouth Rinse route 2 times daily at 12 noon and 4 pm. 02/27/16  Yes Nicholes Mango, MD  Multiple Vitamin (THEREMS) TABS Take 1 tablet by mouth daily.   Yes Historical Provider, MD  oseltamivir (TAMIFLU) 30 MG capsule Take 1 capsule (30 mg total) by mouth 2 (two) times daily. Patient taking differently: Take 30 mg by mouth 2 (two) times daily. End date is 02/29/16 02/27/16  Yes Nicholes Mango, MD  phenazopyridine (PYRIDIUM) 100 MG tablet Take 100 mg by mouth at bedtime as needed for pain.   Yes Historical Provider, MD  polyethylene glycol (MIRALAX / GLYCOLAX) packet Take  17 g by mouth at bedtime.   Yes Historical Provider, MD  traMADol (ULTRAM) 50 MG tablet Take 1 tablet (50 mg total) by mouth every 6 (six) hours as needed for moderate pain. 02/27/16  Yes Nicholes Mango, MD    Allergies  Allergen Reactions  . Alendronate Shortness Of Breath  . Candesartan Shortness Of Breath  . Amoxicillin Diarrhea  . Atorvastatin     Other reaction(s): Muscle Pain  . Ceftriaxone     Other reaction(s): Muscle Pain  . Levofloxacin      Other reaction(s): Unknown  . Lisinopril     Other reaction(s): Unknown  . Losartan     Other reaction(s): Unknown  . Nizatidine     Other reaction(s): Other (See Comments) Blurred vision  . Nsaids     Other reaction(s): Unknown  . Omeprazole Magnesium Nausea Only  . Psyllium     Other reaction(s): Unknown  . Sertaconazole     Other reaction(s): Unknown  . Sulfa Antibiotics Rash    No family history on file.  Social History Social History  Substance Use Topics  . Smoking status: Never Smoker  . Smokeless tobacco: Never Used  . Alcohol use Not on file    Review of Systems* Unable to obtain an adequate review of systems at this time secondary to significant dementia  ____________________________________________   PHYSICAL EXAM:  VITAL SIGNS: ED Triage Vitals  Enc Vitals Group     BP 02/28/16 1318 133/73     Pulse Rate 02/28/16 1318 (!) 112     Resp 02/28/16 1318 (!) 26     Temp 02/28/16 1318 (!) 100.4 F (38 C)     Temp Source 02/28/16 1318 Axillary     SpO2 02/28/16 1318 95 %     Weight 02/28/16 1319 150 lb (68 kg)     Height 02/28/16 1319 5' 1"  (1.549 m)     Head Circumference --      Peak Flow --      Pain Score --      Pain Loc --      Pain Edu? --      Excl. in [place]? --     Constitutional: Alert, we'll make eye contact but does not answer questions or follow commands. Eyes: Normal exam ENT   Head: Normocephalic and atraumatic.   Mouth/Throat: Mucous membranes are moist. Cardiovascular: Regular rhythm and rate around 120 bpm. Respiratory: Moderate tachypnea with diffuse rhonchi right greater than left. Gastrointestinal: Soft and nontender. No distention.  Musculoskeletal: Nontender with normal range of motion in all extremities.  Neurologic:  Normal speech and language. No gross focal neurologic deficits Skin:  Skin is warm, dry and intact.  Psychiatric: Mood and affect are normal.   ____________________________________________     EKG  EKG reviewed and interpreted by myself shows sinus tachycardia at 111 bpm. Narrow QRS, left axis deviation, largely normal intervals, nonspecific ST changes. No ST elevation.  ____________________________________________    RADIOLOGY  bibasilar atelectasis  ____________________________________________   INITIAL IMPRESSION / ASSESSMENT AND PLAN / ED COURSE  Pertinent labs & imaging results that were available during my care of the patient were reviewed by me and considered in my medical decision making (see chart for details).  Patient presents to the emergency department via EMS for increased weakness and difficulty breathing. Patient has audible rhonchi, right greater than left on examination. Patient with moderate tachypnea, tachycardia and febrile to 100.4. Sepsis criteria met, sepsis protocols have been initiated. Patient with  recent diagnosis of influenza A 02/24/16, likely sequela a of influenza. We'll obtain labs and chest x-ray to evaluate for serious bacterial illness.  Patient's labs show nitrite positive urine. Patient's chest x-ray is negative. Given the patient's tachycardia fever with nitrite positive urine altered mental status a she'll be admitted for further treatment and IV antibiotics. Per EMS report the patient is unable to eat or drink anything today which is abnormal for her. ____________________________________________   FINAL CLINICAL IMPRESSION(S) / ED DIAGNOSES  Altered mental status Sepsis Urinary tract infection    Harvest Dark, MD 02/28/16 1536

## 2016-02-28 NOTE — H&P (Signed)
History and Physical    MERCIA DOWE ZOX:[id number] DOB: Jun 22, 1928 DOA: 02/28/2016  Referring physician: Dr. Lenard Lance PCP: No PCP Per Patient  Specialists: none  Chief Complaint: fever  HPI: Regina Fields is a 83 y.o. female has a past medical history significant for recent hospitalization for Influenza A d/c'd to SNF yesterday now with fever, tachycardia, and UTI. Pt is lethargic and non-verbal. Moans to palpation. No family present. Pt is DNR per facility. Unable to eat or take po's. Palliative Care consult has been ordered but not done yet.  Review of Systems: unable to obtain due to lethargy and confusion.   Past Medical History:  Diagnosis Date  . Anxiety   . Dementia   . GERD (gastroesophageal reflux disease)   . Hypertension   . Hypothyroidism   . IBS (irritable bowel syndrome)   . Migraine   . Osteoarthritis    Past Surgical History:  Procedure Laterality Date  . TONSILLECTOMY     Social History:  reports that she has never smoked. She has never used smokeless tobacco. Her alcohol and drug histories are not on file.  Allergies  Allergen Reactions  . Alendronate Shortness Of Breath  . Candesartan Shortness Of Breath  . Amoxicillin Diarrhea  . Atorvastatin     Other reaction(s): Muscle Pain  . Ceftriaxone     Other reaction(s): Muscle Pain  . Levofloxacin     Other reaction(s): Unknown  . Lisinopril     Other reaction(s): Unknown  . Losartan     Other reaction(s): Unknown  . Nizatidine     Other reaction(s): Other (See Comments) Blurred vision  . Nsaids     Other reaction(s): Unknown  . Omeprazole Magnesium Nausea Only  . Psyllium     Other reaction(s): Unknown  . Sertaconazole     Other reaction(s): Unknown  . Sulfa Antibiotics Rash    History reviewed. No pertinent family history.  Prior to Admission medications   Medication Sig Start Date End Date Taking? Authorizing Provider  acetaminophen (TYLENOL) 325 MG tablet Take 650 mg by mouth  3 (three) times daily as needed for mild pain.    Yes Historical Provider, MD  albuterol (PROVENTIL HFA;VENTOLIN HFA) 108 (90 Base) MCG/ACT inhaler Inhale 2 puffs into the lungs every 6 (six) hours as needed for wheezing or shortness of breath. 12/13/15  Yes Myrna Blazer, MD  aspirin 81 MG chewable tablet Chew 81 mg by mouth daily.   Yes Historical Provider, MD  bisacodyl (DULCOLAX) 10 MG suppository Place 1 suppository (10 mg total) rectally daily as needed for moderate constipation. 01/14/16  Yes Shaune Pollack, MD  ipratropium-albuterol (DUONEB) 0.5-2.5 (3) MG/3ML SOLN Take 3 mLs by nebulization every 6 (six) hours. 03/13/15  Yes Srikar Sudini, MD  levothyroxine (SYNTHROID, LEVOTHROID) 137 MCG tablet Take 137 mcg by mouth daily. @ 2000   Yes Historical Provider, MD  montelukast (SINGULAIR) 10 MG tablet Take 10 mg by mouth every evening.   Yes Historical Provider, MD  mouth rinse LIQD solution 15 mLs by Mouth Rinse route 2 times daily at 12 noon and 4 pm. 02/27/16  Yes Ramonita Lab, MD  Multiple Vitamin (THEREMS) TABS Take 1 tablet by mouth daily.   Yes Historical Provider, MD  oseltamivir (TAMIFLU) 30 MG capsule Take 1 capsule (30 mg total) by mouth 2 (two) times daily. Patient taking differently: Take 30 mg by mouth 2 (two) times daily. End date is 02/29/16 02/27/16  Yes Ramonita Lab, MD  phenazopyridine (  PYRIDIUM) 100 MG tablet Take 100 mg by mouth at bedtime as needed for pain.   Yes Historical Provider, MD  polyethylene glycol (MIRALAX / GLYCOLAX) packet Take 17 g by mouth at bedtime.   Yes Historical Provider, MD  traMADol (ULTRAM) 50 MG tablet Take 1 tablet (50 mg total) by mouth every 6 (six) hours as needed for moderate pain. 02/27/16  Yes Ramonita Lab, MD   Physical Exam: Vitals:   02/28/16 1430 02/28/16 1500 02/28/16 1502 02/28/16 1530  BP: (!) 117/58 (!) 128/59  (!) 115/46  Pulse: (!) 111 (!) 118  (!) 110  Resp: (!) 30 (!) 30  (!) 32  Temp:   (!) 100.5 F (38.1 C)   TempSrc:   Rectal    SpO2: 95% 97%  95%  Weight:      Height:         General:  Lethargic, non-verbal, moaning in pain  Eyes: PERRL, EOMI, no scleral icterus, conjunctiva clear  ENT:  Dry oropharynx without exudate, TM's benign, dentition poor  Neck: supple, no lymphadenopathy. No bruits or thyromegaly  Cardiovascular: rapid rate with regular rhythm without MRG; 2+ peripheral pulses, no JVD, 1+ peripheral edema  Respiratory: diffuse rhonchi without wheezes or rales. Basilar dullness noted. Respiratory effort increased  Abdomen: soft,  tender to palpation, positive bowel sounds, no guarding, no rebound  Skin: no rashes or lesions  Musculoskeletal: normal bulk and tone, no joint swelling  Psychiatric: unable to assess  Neurologic: CN 2-12 grossly intact, Motor strength 4/5 in all 4 groups with symmetric DTR's and non-focal sensory exam  Labs on Admission:  Basic Metabolic Panel:  Recent Labs Lab 02/24/16 1106 02/24/16 1828 02/25/16 0506 02/25/16 1455 02/26/16 0408 02/28/16 1402  NA 151* 147* 150* 147* 146* 151*  K 3.8  --  3.7 3.4* 3.2* 4.5  CL 121*  --  119* 118* 117* 122*  CO2 24  --  23 23 24 22   GLUCOSE 148*  --  137* 164* 105* 105*  BUN 48*  --  44* 44* 43* 38*  CREATININE 1.40*  --  1.26* 1.11* 1.06* 1.00  CALCIUM 8.8*  --  8.4* 8.2* 8.1* 9.1   Liver Function Tests:  Recent Labs Lab 02/24/16 1106 02/28/16 1402  AST 81* 50*  ALT 128* 76*  ALKPHOS 70 65  BILITOT 1.4* 1.0  PROT 8.1 7.9  ALBUMIN 3.5 3.5   No results for input(s): LIPASE, AMYLASE in the last 168 hours. No results for input(s): AMMONIA in the last 168 hours. CBC:  Recent Labs Lab 02/24/16 1012 02/25/16 0506 02/28/16 1402  WBC 10.3 8.6 8.2  NEUTROABS 5.4  --  PENDING  HGB 12.1 11.1* 11.0*  HCT 36.4 33.9* 33.6*  MCV 88.2 90.4 88.0  PLT 223 162 153   Cardiac Enzymes:  Recent Labs Lab 02/28/16 1402  TROPONINI <0.03    BNP (last 3 results)  Recent Labs  12/13/15 1549  BNP 38.0     ProBNP (last 3 results) No results for input(s): PROBNP in the last 8760 hours.  CBG: No results for input(s): GLUCAP in the last 168 hours.  Radiological Exams on Admission: Dg Chest 2 View  Result Date: 02/26/2016 CLINICAL DATA:  Dementia, acute kidney injury, shortness of breath, hypertension EXAM: CHEST  2 VIEW COMPARISON:  02/24/2016 FINDINGS: Normal heart size, mediastinal contours, and pulmonary vascularity. LEFT pleural effusion with bibasilar atelectasis greater on LEFT. Upper lungs clear. Central peribronchial thickening present. No pneumothorax. Bones demineralized. Prior LEFT humeral  ORIF. IMPRESSION: LEFT pleural effusion with bibasilar atelectasis and central bronchitic changes. Electronically Signed   By: Ulyses SouthwardMark  Boles M.D.   On: 02/26/2016 16:23   Dg Chest Portable 1 View  Result Date: 02/28/2016 CLINICAL DATA:  Wheezing and crackles EXAM: PORTABLE CHEST 1 VIEW COMPARISON:  02/26/2016 FINDINGS: Lungs are under aerated with bibasilar atelectasis. Normal heart size. No pneumothorax or pleural effusion. IMPRESSION: Bibasilar atelectasis. Electronically Signed   By: Jolaine ClickArthur  Hoss M.D.   On: 02/28/2016 14:49    EKG: Independently reviewed.  Assessment/Plan Principal Problem:   SIRS (systemic inflammatory response syndrome) (HCC) Active Problems:   UTI (urinary tract infection)   Acute encephalopathy   Failure to thrive in adult   Will admit to floor as DNR with IV fluids and IV ABX. NPO. Consult Palliative Care, ST, and PT. Prognosis poor. Repeat labs in AM.  Diet: NPO Fluids: 1/2 ns @75  DVT Prophylaxis: SQ Heparin  Code Status: DNR  Family Communication: none  Disposition Plan: Hospice Home  Time spent: 50 min

## 2016-02-29 DIAGNOSIS — L8990 Pressure ulcer of unspecified site, unspecified stage: Secondary | ICD-10-CM | POA: Insufficient documentation

## 2016-02-29 LAB — URINE CULTURE: CULTURE: NO GROWTH

## 2016-02-29 LAB — BLOOD CULTURE ID PANEL (REFLEXED)
ACINETOBACTER BAUMANNII: NOT DETECTED
CANDIDA PARAPSILOSIS: NOT DETECTED
Candida albicans: NOT DETECTED
Candida glabrata: NOT DETECTED
Candida krusei: NOT DETECTED
Candida tropicalis: NOT DETECTED
ENTEROCOCCUS SPECIES: NOT DETECTED
Enterobacter cloacae complex: NOT DETECTED
Enterobacteriaceae species: NOT DETECTED
Escherichia coli: NOT DETECTED
Haemophilus influenzae: NOT DETECTED
Klebsiella oxytoca: NOT DETECTED
Klebsiella pneumoniae: NOT DETECTED
LISTERIA MONOCYTOGENES: NOT DETECTED
METHICILLIN RESISTANCE: DETECTED — AB
Neisseria meningitidis: NOT DETECTED
PROTEUS SPECIES: NOT DETECTED
PSEUDOMONAS AERUGINOSA: NOT DETECTED
SERRATIA MARCESCENS: NOT DETECTED
STAPHYLOCOCCUS AUREUS BCID: NOT DETECTED
STAPHYLOCOCCUS SPECIES: DETECTED — AB
STREPTOCOCCUS PNEUMONIAE: NOT DETECTED
Streptococcus agalactiae: NOT DETECTED
Streptococcus pyogenes: NOT DETECTED
Streptococcus species: NOT DETECTED

## 2016-02-29 LAB — CULTURE, BLOOD (ROUTINE X 2)
CULTURE: NO GROWTH
CULTURE: NO GROWTH

## 2016-02-29 LAB — COMPREHENSIVE METABOLIC PANEL
ALBUMIN: 3.1 g/dL — AB (ref 3.5–5.0)
ALK PHOS: 54 U/L (ref 38–126)
ALT: 60 U/L — AB (ref 14–54)
AST: 50 U/L — AB (ref 15–41)
Anion gap: 9 (ref 5–15)
BILIRUBIN TOTAL: 0.9 mg/dL (ref 0.3–1.2)
BUN: 36 mg/dL — AB (ref 6–20)
CALCIUM: 8.3 mg/dL — AB (ref 8.9–10.3)
CO2: 17 mmol/L — ABNORMAL LOW (ref 22–32)
CREATININE: 0.95 mg/dL (ref 0.44–1.00)
Chloride: 123 mmol/L — ABNORMAL HIGH (ref 101–111)
GFR calc Af Amer: >60 mL/min (ref >60)
GFR calc non Af Amer: 53 mL/min — ABNORMAL LOW (ref >60)
GLUCOSE: 101 mg/dL — AB (ref 65–99)
Potassium: 4.5 mmol/L (ref 3.5–5.1)
Sodium: 149 mmol/L — ABNORMAL HIGH (ref 135–145)
TOTAL PROTEIN: 6.8 g/dL (ref 6.5–8.1)

## 2016-02-29 LAB — CBC
HCT: 29.7 % — ABNORMAL LOW (ref 35.0–47.0)
Hemoglobin: 9.8 g/dL — ABNORMAL LOW (ref 12.0–16.0)
MCH: 29.3 pg (ref 26.0–34.0)
MCHC: 33 g/dL (ref 32.0–36.0)
MCV: 88.8 fL (ref 80.0–100.0)
Platelets: 109 10*3/uL — ABNORMAL LOW (ref 150–440)
RBC: 3.35 MIL/uL — ABNORMAL LOW (ref 3.80–5.20)
RDW: 17.2 % — AB (ref 11.5–14.5)
WBC: 7.9 10*3/uL (ref 3.6–11.0)

## 2016-02-29 MED ORDER — VANCOMYCIN HCL IN DEXTROSE 1-5 GM/200ML-% IV SOLN
1000.0000 mg | Freq: Once | INTRAVENOUS | Status: AC
  Administered 2016-02-29: 1000 mg via INTRAVENOUS
  Filled 2016-02-29: qty 200

## 2016-02-29 MED ORDER — IPRATROPIUM-ALBUTEROL 0.5-2.5 (3) MG/3ML IN SOLN
3.0000 mL | Freq: Three times a day (TID) | RESPIRATORY_TRACT | Status: DC
  Administered 2016-02-29 – 2016-03-02 (×7): 3 mL via RESPIRATORY_TRACT
  Filled 2016-02-29 (×8): qty 3

## 2016-02-29 NOTE — Progress Notes (Signed)
[facility] - [place] at [facility]   PATIENT NAME: Regina Fields    MR#:  [id number]  DATE OF BIRTH:  [birth year]  SUBJECTIVE:  CHIEF COMPLAINT:   Chief Complaint  Patient presents with  . Shortness of Breath  . Influenza    recent discharge for influenza to SNF sne back in 1 day for lethargy an not eating.  Found to have UTI,and remains lethargic.  REVIEW OF SYSTEMS:   pt is lethargic and not giving any details.  ROS  DRUG ALLERGIES:   Allergies  Allergen Reactions  . Alendronate Shortness Of Breath  . Candesartan Shortness Of Breath  . Amoxicillin Diarrhea  . Atorvastatin     Other reaction(s): Muscle Pain  . Ceftriaxone     Other reaction(s): Muscle Pain  . Levofloxacin     Other reaction(s): Unknown  . Lisinopril     Other reaction(s): Unknown  . Losartan     Other reaction(s): Unknown  . Nizatidine     Other reaction(s): Other (See Comments) Blurred vision  . Nsaids     Other reaction(s): Unknown  . Omeprazole Magnesium Nausea Only  . Psyllium     Other reaction(s): Unknown  . Sertaconazole     Other reaction(s): Unknown  . Sulfa Antibiotics Rash    VITALS:  Blood pressure (!) 134/54, pulse (!) 108, temperature 98.3 F (36.8 C), temperature source Oral, resp. rate 18, height 5\' 1"  (1.549 m), weight 62.4 kg (137 lb 9.6 oz), SpO2 93 %.  PHYSICAL EXAMINATION:  GENERAL:  83 y.o.-year-old patient lying in the bed with no acute distress.  EYES: Pupils equal, round, reactive to light and accommodation. No scleral icterus. Extraocular muscles intact.  HEENT: Head atraumatic, normocephalic. Oropharynx and nasopharynx clear.  NECK:  Supple, no jugular venous distention. No thyroid enlargement, no tenderness.  LUNGS: Normal breath sounds bilaterally, no wheezing, rales,rhonchi or crepitation. No use of accessory muscles of respiration.  CARDIOVASCULAR: S1, S2 normal. No murmurs, rubs, or gallops.  ABDOMEN: Soft, nontender, nondistended. Bowel  sounds present. No organomegaly or mass.  EXTREMITIES: No pedal edema, cyanosis, or clubbing.  NEUROLOGIC: Pt is lethargic. Moves some to stimuli, and speak some words, but no communications. PSYCHIATRIC: The patient is lethargic.  SKIN: No obvious rash, lesion, or ulcer.   Physical Exam LABORATORY PANEL:   CBC  Recent Labs Lab 02/29/16 0455  WBC 7.9  HGB 9.8*  HCT 29.7*  PLT 109*   ------------------------------------------------------------------------------------------------------------------  Chemistries   Recent Labs Lab 02/29/16 0455  NA 149*  K 4.5  CL 123*  CO2 17*  GLUCOSE 101*  BUN 36*  CREATININE 0.95  CALCIUM 8.3*  AST 50*  ALT 60*  ALKPHOS 54  BILITOT 0.9   ------------------------------------------------------------------------------------------------------------------  Cardiac Enzymes  Recent Labs Lab 02/28/16 1402  TROPONINI <0.03   ------------------------------------------------------------------------------------------------------------------  RADIOLOGY:  Dg Chest Portable 1 View  Result Date: 02/28/2016 CLINICAL DATA:  Wheezing and crackles EXAM: PORTABLE CHEST 1 VIEW COMPARISON:  02/26/2016 FINDINGS: Lungs are under aerated with bibasilar atelectasis. Normal heart size. No pneumothorax or pleural effusion. IMPRESSION: Bibasilar atelectasis. Electronically Signed   By: Jolaine Click M.D.   On: 02/28/2016 14:49    ASSESSMENT AND PLAN:   Principal Problem:   SIRS (systemic inflammatory response syndrome) (HCC) Active Problems:   UTI (urinary tract infection)   Acute encephalopathy   Failure to thrive in adult   Pressure injury of skin   * SIRS- UTI, Altered mental status   Bacteremia? May be  contamination   Failure to thrive,    IV ABx, IV fluids,    Keep NPO, SLP eval.   Pallaitive care consult.   May be appropirate for hospice.      All the records are reviewed and case discussed with Care Management/Social  Workerr. Management plans discussed with the patient, family and they are in agreement.  CODE STATUS: DNR  TOTAL TIME TAKING CARE OF THIS PATIENT: 30 minutes.     POSSIBLE D/C IN 1-2 DAYS, DEPENDING ON CLINICAL CONDITION.   Altamese DillingVACHHANI, Alva Broxson M.D on 02/29/2016   Between 7am to 6pm - Pager - [phone]  After 6pm go to [email or web address] - Social research officer, governmentpassword [email or web address]  [facility] [place] [facility]  Office  [phone]  CC: Primary care physician; No PCP Per Patient  Note: This dictation was prepared with Dragon dictation along with smaller phrase technology. Any transcriptional errors that result from this process are unintentional.

## 2016-02-29 NOTE — Evaluation (Signed)
Physical Therapy Evaluation Patient Details Name: Regina Fields MRN: [id number] DOB: 03-04-1928 Today's Date: 02/29/2016   History of Present Illness  Pt is a 83 y/o F with known h/o dementia and adult failure to thrive who was last admitted in December for AKI, hypernatremia and UTI, [facility]'d back to [facility] (with [facility]?), returned to ED with fever and congestion and was found to have +Influenza A and was admitted for sepsis due to UTI, returned to SNF and is now back with sepsis.   Clinical Impression  Limited PT eval secondary to pt's mental status, hypersensitive response to even gentle, mid range PROM and generally pt's inability to meaningfully interact or attempt even basic exercises.  Unsure as to pt's PLOF but given her recent admits and palliative consult this PT expects that she was essentially dependent assist and given her inability to interact with PT this admit and last admit further PT is not justified as she is unable to participate or interact appropriately.  No further PT as this time, will need new orders it pt shows ability to communicate/participate/perform some non-dependent mobility.    Follow Up Recommendations No PT follow up    Equipment Recommendations       Recommendations for Other Services       Precautions / Restrictions Precautions Precautions: Fall Restrictions Weight Bearing Restrictions: No      Mobility  Bed Mobility               General bed mobility comments: Unable to attempt at this time, pt too weak, disoriented and c/o pain with even the most minimal PROM of extremities  Transfers                    Ambulation/Gait                Stairs            Wheelchair Mobility    Modified Rankin (Stroke Patients Only)       Balance                                             Pertinent Vitals/Pain Pain Assessment:  (unable to rate, yelling out in confused pain with most PROM )    Home  Living Family/patient expects to be discharged to:: Skilled nursing facility                      Prior Function Level of Independence: Needs assistance         Comments: Pt unable to interact verbally, appears likely that she did little to no mobility w/o max assist     Hand Dominance        Extremity/Trunk Assessment   Upper Extremity Assessment Upper Extremity Assessment: Generalized weakness;Difficult to assess due to impaired cognition (Pt with no AROM, moaning with even limited PROM) RUE Deficits / Details: No movement appreciated.  Pt unable to follow verbal cues or demonstration.   LUE Deficits / Details: No movement appreciated.  Pt unable to follow verbal cues or demonstration.  Limited digit extension.    Lower Extremity Assessment Lower Extremity Assessment: Generalized weakness;Difficult to assess due to impaired cognition (Pt with no AROM, moaning with even limited PROM) RLE Deficits / Details: No movement appreciated.  Pt unable to follow verbal cues or demonstration.  Limited DF to  neutral. LLE Deficits / Details: No movement appreciated.  Pt unable to follow verbal cues or demonstration.  Limited DF to neutral.       Communication   Communication: Expressive difficulties  Cognition Arousal/Alertness:  (awake but completely disoriented) Behavior During Therapy: Flat affect;Anxious Overall Cognitive Status: Difficult to assess                 General Comments: Pt does not answer any questions or follow any commands.  She moans only.   No family present to reports pt's cognitive status PTA.    General Comments      Exercises     Assessment/Plan    PT Assessment Patent does not need any further PT services  PT Problem List            PT Treatment Interventions      PT Goals (Current goals can be found in the Care Plan section)  Acute Rehab PT Goals Patient Stated Goal: unable to state PT Goal Formulation: All assessment and  education complete, DC therapy    Frequency     Barriers to discharge        Co-evaluation               End of Session   Activity Tolerance:  (limited by confusion, "pain," fatigue) Patient left: with call bell/phone within reach;with bed alarm set Nurse Communication: Mobility status;Need for lift equipment         Time: 1610-9604 PT Time Calculation (min) (ACUTE ONLY): 13 min   Charges:   PT Evaluation $PT Eval Low Complexity: 1 Procedure     PT G Codes:        Malachi Pro, DPT 02/29/2016, 10:30 AM

## 2016-02-29 NOTE — Progress Notes (Signed)
Pharmacy Antibiotic Note  Regina Fields is a 83 y.o. female admitted on 02/28/2016 with pneumonia and UTI.  Pharmacy has been consulted for vancomycin dosing.  Plan: Per MD, one dose of vancomycin today (does not want us to continue dosing). MD stated will follow up culture tomorrow and if negative will d/c vanc.  Will order vancomycin 1000 mg IV x1 which is ~16 mg/kg load.   Ke 0.032, half life 21.7, Vd 43.7 Fields suggests pt would need q24h dosing.   Height: 5\' 1"  (154.9 cm) Weight: 137 lb 9.6 oz (62.4 kg) IBW/kg (Calculated) : 47.8  Temp (24hrs), Avg:99.2 F (37.3 C), Min:97.9 F (36.6 C), Max:100.5 F (38.1 C)   Recent Labs Lab 02/24/16 1012  02/24/16 1106 02/24/16 1438 02/25/16 0506 02/25/16 1455 02/26/16 0408 02/28/16 1402 02/29/16 0455  WBC 10.3  --   --   --  8.6  --   --  8.2 7.9  CREATININE  --   < > 1.40*  --  1.26* 1.11* 1.06* 1.00 0.95  LATICACIDVEN  --   --  1.6 2.3*  --   --   --  1.5  --   < > = values in this interval not displayed.  Estimated Creatinine Clearance: 36.6 mL/min (by C-G formula based on SCr of 0.95 mg/dL).    Allergies  Allergen Reactions  . Alendronate Shortness Of Breath  . Candesartan Shortness Of Breath  . Amoxicillin Diarrhea  . Atorvastatin     Other reaction(s): Muscle Pain  . Ceftriaxone     Other reaction(s): Muscle Pain  . Levofloxacin     Other reaction(s): Unknown  . Lisinopril     Other reaction(s): Unknown  . Losartan     Other reaction(s): Unknown  . Nizatidine     Other reaction(s): Other (See Comments) Blurred vision  . Nsaids     Other reaction(s): Unknown  . Omeprazole Magnesium Nausea Only  . Psyllium     Other reaction(s): Unknown  . Sertaconazole     Other reaction(s): Unknown  . Sulfa Antibiotics Rash      Thank you for allowing pharmacy to be a part of this patient's care.  Regina Fields, Regina Fields 02/29/2016 12:12 PM

## 2016-02-29 NOTE — Progress Notes (Signed)
PHARMACY - PHYSICIAN COMMUNICATION CRITICAL VALUE ALERT - BLOOD CULTURE IDENTIFICATION (BCID)  Results for orders placed or performed during the hospital encounter of 02/28/16  Blood Culture ID Panel (Reflexed) (Collected: 02/28/2016  2:10 PM)  Result Value Ref Range   Enterococcus species NOT DETECTED NOT DETECTED   Listeria monocytogenes NOT DETECTED NOT DETECTED   Staphylococcus species DETECTED (A) NOT DETECTED   Staphylococcus aureus NOT DETECTED NOT DETECTED   Methicillin resistance DETECTED (A) NOT DETECTED   Streptococcus species NOT DETECTED NOT DETECTED   Streptococcus agalactiae NOT DETECTED NOT DETECTED   Streptococcus pneumoniae NOT DETECTED NOT DETECTED   Streptococcus pyogenes NOT DETECTED NOT DETECTED   Acinetobacter baumannii NOT DETECTED NOT DETECTED   Enterobacteriaceae species NOT DETECTED NOT DETECTED   Enterobacter cloacae complex NOT DETECTED NOT DETECTED   Escherichia coli NOT DETECTED NOT DETECTED   Klebsiella oxytoca NOT DETECTED NOT DETECTED   Klebsiella pneumoniae NOT DETECTED NOT DETECTED   Proteus species NOT DETECTED NOT DETECTED   Serratia marcescens NOT DETECTED NOT DETECTED   Haemophilus influenzae NOT DETECTED NOT DETECTED   Neisseria meningitidis NOT DETECTED NOT DETECTED   Pseudomonas aeruginosa NOT DETECTED NOT DETECTED   Candida albicans NOT DETECTED NOT DETECTED   Candida glabrata NOT DETECTED NOT DETECTED   Candida krusei NOT DETECTED NOT DETECTED   Candida parapsilosis NOT DETECTED NOT DETECTED   Candida tropicalis NOT DETECTED NOT DETECTED  Per micro lab 1/4 bottles (1/2 sets)  Name of physician (or Provider) Contacted: Dr Elisabeth PigeonVachhani  Changes to prescribed antibiotics required: Per MD, one dose of vancomycin today (does not want us to continue dosing). MD stated will follow up culture tomorrow and if negative will d/c vanc. Follow up culture results tomorrow.  Confirmed with MD that he does not want us to order scheduled doses of vanc but  only wanted one dose of vancomycin for today.   Crist FatWang, Shanquita Ronning L 02/29/2016  12:00 PM

## 2016-03-01 DIAGNOSIS — E440 Moderate protein-calorie malnutrition: Secondary | ICD-10-CM | POA: Insufficient documentation

## 2016-03-01 MED ORDER — CEFUROXIME AXETIL 500 MG PO TABS
250.0000 mg | ORAL_TABLET | Freq: Two times a day (BID) | ORAL | Status: DC
  Administered 2016-03-01 – 2016-03-02 (×3): 250 mg via ORAL
  Filled 2016-03-01 (×3): qty 1

## 2016-03-01 MED ORDER — AZITHROMYCIN 250 MG PO TABS
250.0000 mg | ORAL_TABLET | Freq: Every day | ORAL | Status: DC
  Administered 2016-03-01 – 2016-03-02 (×2): 250 mg via ORAL
  Filled 2016-03-01 (×2): qty 1

## 2016-03-01 NOTE — Care Management Important Message (Signed)
Important Message  Patient Details  Name: Regina Fields MRN: [id number] Date of Birth: [birth year]   Medicare Important Message Given:  Yes    Gwenette GreetBrenda S Uthman Mroczkowski, RN 03/01/2016, 8:46 AM

## 2016-03-01 NOTE — Progress Notes (Signed)
Palliative Medicine consult noted. Due to high referral volume, there may be a delay seeing this patient. Please call the Palliative Medicine Team office at [phone] if recommendations are needed in the interim.  Thank you for inviting us to see this patient.  Margret ChanceMelanie G. Lawayne Hartig, RN, BSN, [facility] 03/01/2016 10:46 AM Cell [phone] 8:00-4:00 Monday-Friday Office [phone]

## 2016-03-01 NOTE — Progress Notes (Signed)
[employer] - [place] [facility]   PATIENT NAME: Regina Fields    MR#:  [id number]  DATE OF BIRTH:  1928/09/08  SUBJECTIVE:  CHIEF COMPLAINT:   Chief Complaint  Patient presents with  . Shortness of Breath  . Influenza    recent discharge for influenza to SNF sne back in 1 day for lethargy an not eating.  Found to have UTI,and remains alert today but confused.  REVIEW OF SYSTEMS:   pt is lethargic and not giving any details.  ROS  DRUG ALLERGIES:   Allergies  Allergen Reactions  . Alendronate Shortness Of Breath  . Candesartan Shortness Of Breath  . Amoxicillin Diarrhea  . Atorvastatin     Other reaction(s): Muscle Pain  . Ceftriaxone     Other reaction(s): Muscle Pain  . Levofloxacin     Other reaction(s): Unknown  . Lisinopril     Other reaction(s): Unknown  . Losartan     Other reaction(s): Unknown  . Nizatidine     Other reaction(s): Other (See Comments) Blurred vision  . Nsaids     Other reaction(s): Unknown  . Omeprazole Magnesium Nausea Only  . Psyllium     Other reaction(s): Unknown  . Sertaconazole     Other reaction(s): Unknown  . Sulfa Antibiotics Rash    VITALS:  Blood pressure (!) 139/37, pulse 93, temperature 98 F (36.7 C), temperature source Oral, resp. rate 18, height 5\' 1"  (1.549 m), weight 65.2 kg (143 lb 12.8 oz), SpO2 97 %.  PHYSICAL EXAMINATION:  GENERAL:  83 y.o.-year-old patient lying in the bed with no acute distress.  EYES: Pupils equal, round, reactive to light and accommodation. No scleral icterus. Extraocular muscles intact.  HEENT: Head atraumatic, normocephalic. Oropharynx and nasopharynx clear.  NECK:  Supple, no jugular venous distention. No thyroid enlargement, no tenderness.  LUNGS: Normal breath sounds bilaterally, no wheezing, rales,rhonchi or crepitation. No use of accessory muscles of respiration.  CARDIOVASCULAR: S1, S2 normal. No murmurs, rubs, or gallops.  ABDOMEN: Soft, nontender,  nondistended. Bowel sounds present. No organomegaly or mass.  EXTREMITIES: No pedal edema, cyanosis, or clubbing.  NEUROLOGIC: Pt is alert but confused and non communicative much. Moves some to stimuli, and speak some words, but no communications. PSYCHIATRIC: The patient is confused.  SKIN: No obvious rash, lesion, or ulcer.   Physical Exam LABORATORY PANEL:   CBC  Recent Labs Lab 02/29/16 0455  WBC 7.9  HGB 9.8*  HCT 29.7*  PLT 109*   ------------------------------------------------------------------------------------------------------------------  Chemistries   Recent Labs Lab 02/29/16 0455  NA 149*  K 4.5  CL 123*  CO2 17*  GLUCOSE 101*  BUN 36*  CREATININE 0.95  CALCIUM 8.3*  AST 50*  ALT 60*  ALKPHOS 54  BILITOT 0.9   ------------------------------------------------------------------------------------------------------------------  Cardiac Enzymes  Recent Labs Lab 02/28/16 1402  TROPONINI <0.03   ------------------------------------------------------------------------------------------------------------------  RADIOLOGY:  No results found.  ASSESSMENT AND PLAN:   Principal Problem:   SIRS (systemic inflammatory response syndrome) (HCC) Active Problems:   UTI (urinary tract infection)   Acute encephalopathy   Failure to thrive in adult   Pressure injury of skin   Malnutrition of moderate degree   * SIRS- UTI, Altered mental status   Bacteremia? May be contamination   Failure to thrive,    More alert today, but confused.   IV ABx, IV fluids, - lost her IV line , so switched to oral.   Keep NPO, SLP eval appreciated- Dysphagia 1 diet- started.  Pallaitive care consult.   May be appropirate for hospice.    All the records are reviewed and case discussed with Care Management/Social Workerr. Management plans discussed with the patient, family and they are in agreement.  CODE STATUS: DNR  TOTAL TIME TAKING CARE OF THIS PATIENT: 30  minutes.     POSSIBLE D/C IN 1-2 DAYS, DEPENDING ON CLINICAL CONDITION.   Altamese Dilling M.D on 03/01/2016   Between 7am to 6pm - Pager - [phone]  After 6pm go to [email or web address] - Social research officer, government  [facility]  Office  [phone]  CC: Primary care physician; No PCP Per Patient  Note: This dictation was prepared with Dragon dictation along with smaller phrase technology. Any transcriptional errors that result from this process are unintentional.

## 2016-03-01 NOTE — Consult Note (Signed)
Consultation Note Date: 03/01/2016   Patient Name: Regina Fields  DOB: 1928/06/04  MRN: [id number]  Age / Sex: 83 y.o., female  PCP: No Pcp Per Patient Referring Physician: Altamese Dilling, MD  Reason for Consultation: Establishing goals of care and Psychosocial/spiritual support  HPI/Patient Profile: 83 y.o. female   admitted on 02/28/2016 with past medical history of osteoarthritis, migraines, IBS, hypothyroidism, hypertension, GERD, ES-dementia, and overall failure to thrive.  Hospitalized last week and readmitted for URI and UTI  Family faces advanced directive decisions and anticipatory care needs.  Clinical Assessment and Goals of Care:   This NP Lorinda Creed reviewed medical records, received report from team, assessed the patient and then spoke by telephone with DIL Carley Hammed  at the patient's bedside   to discuss diagnosis, prognosis, GOC, EOL wishes disposition and options.  A detailed discussion was had today regarding advanced directives.  Concepts specific to code status, artifical feeding and hydration, continued IV antibiotics and rehospitalization was had.  The difference between a aggressive medical intervention path  and a palliative comfort care path for this patient at this time was had.  Values and goals of care important to patient and family were attempted to be elicited.  Concept of Hospice and Palliative Care were discussed  Natural trajectory and expectations at EOL were discussed.  Questions and concerns addressed.   Family encouraged to call with questions or concerns.   Plan is for further discussion in the morning for clarification of GOCs   PMT will continue to support holistically.   NEXT OF KIN/son and DIL    SUMMARY OF RECOMMENDATIONS    Code Status/Advance Care Planning:  DNR   Symptom Management:   * Anxiety: Ativan  1 mg po/sl every 6 hours prn     Palliative Prophylaxis:    Aspiration, Bowel Regimen, Delirium Protocol, Frequent Pain Assessment and Oral Care   Psycho-social/Spiritual:   Desire for further Chaplaincy support:no  Additional Recommendations: Education on Hospice and Grief/Bereavement Support  Prognosis:   < 6 months, depends on desire for life prolonging interventions  Discharge Planning: Home with Hospice      Primary Diagnoses: Present on Admission: **None**   I have reviewed the medical record, interviewed the patient and family, and examined the patient. The following aspects are pertinent.  Past Medical History:  Diagnosis Date  . Anxiety   . Dementia   . GERD (gastroesophageal reflux disease)   . Hypertension   . Hypothyroidism   . IBS (irritable bowel syndrome)   . Migraine   . Osteoarthritis    Social History   Social History  . Marital status: Divorced    Spouse name: N/A  . Number of children: N/A  . Years of education: N/A   Social History Main Topics  . Smoking status: Never Smoker  . Smokeless tobacco: Never Used  . Alcohol use None  . Drug use: Unknown  . Sexual activity: Not Currently   Other Topics Concern  . None   Social History Narrative  .  None   History reviewed. No pertinent family history. Scheduled Meds: . aspirin  81 mg Oral Daily  . azithromycin  250 mg Oral QPC supper  . cefUROXime  250 mg Oral BID WC  . docusate sodium  100 mg Oral BID  . ipratropium-albuterol  3 mL Nebulization TID  . levothyroxine  137 mcg Oral QAC breakfast  . mouth rinse  15 mL Mouth Rinse q12n4p  . oseltamivir  30 mg Oral BID   Continuous Infusions: . sodium chloride Stopped (03/01/16 0847)   PRN Meds:.acetaminophen **OR** acetaminophen, bisacodyl, bisacodyl, ipratropium-albuterol, morphine injection, ondansetron **OR** ondansetron (ZOFRAN) IV Medications Prior to Admission:  Prior to Admission medications   Medication Sig Start Date End Date Taking? Authorizing  Provider  acetaminophen (TYLENOL) 325 MG tablet Take 650 mg by mouth 3 (three) times daily as needed for mild pain.    Yes Historical Provider, MD  albuterol (PROVENTIL HFA;VENTOLIN HFA) 108 (90 Base) MCG/ACT inhaler Inhale 2 puffs into the lungs every 6 (six) hours as needed for wheezing or shortness of breath. 12/13/15  Yes Myrna Blazeravid Matthew Schaevitz, MD  aspirin 81 MG chewable tablet Chew 81 mg by mouth daily.   Yes Historical Provider, MD  bisacodyl (DULCOLAX) 10 MG suppository Place 1 suppository (10 mg total) rectally daily as needed for moderate constipation. 01/14/16  Yes Shaune PollackQing Chen, MD  ipratropium-albuterol (DUONEB) 0.5-2.5 (3) MG/3ML SOLN Take 3 mLs by nebulization every 6 (six) hours. 03/13/15  Yes Srikar Sudini, MD  levothyroxine (SYNTHROID, LEVOTHROID) 137 MCG tablet Take 137 mcg by mouth daily. @ 2000   Yes Historical Provider, MD  montelukast (SINGULAIR) 10 MG tablet Take 10 mg by mouth every evening.   Yes Historical Provider, MD  mouth rinse LIQD solution 15 mLs by Mouth Rinse route 2 times daily at 12 noon and 4 pm. 02/27/16  Yes Ramonita LabAruna Gouru, MD  Multiple Vitamin (THEREMS) TABS Take 1 tablet by mouth daily.   Yes Historical Provider, MD  oseltamivir (TAMIFLU) 30 MG capsule Take 1 capsule (30 mg total) by mouth 2 (two) times daily. Patient taking differently: Take 30 mg by mouth 2 (two) times daily. End date is 02/29/16 02/27/16  Yes Ramonita LabAruna Gouru, MD  phenazopyridine (PYRIDIUM) 100 MG tablet Take 100 mg by mouth at bedtime as needed for pain.   Yes Historical Provider, MD  polyethylene glycol (MIRALAX / GLYCOLAX) packet Take 17 g by mouth at bedtime.   Yes Historical Provider, MD  traMADol (ULTRAM) 50 MG tablet Take 1 tablet (50 mg total) by mouth every 6 (six) hours as needed for moderate pain. 02/27/16  Yes Ramonita LabAruna Gouru, MD   Allergies  Allergen Reactions  . Alendronate Shortness Of Breath  . Candesartan Shortness Of Breath  . Amoxicillin Diarrhea  . Atorvastatin     Other reaction(s):  Muscle Pain  . Ceftriaxone     Other reaction(s): Muscle Pain  . Levofloxacin     Other reaction(s): Unknown  . Lisinopril     Other reaction(s): Unknown  . Losartan     Other reaction(s): Unknown  . Nizatidine     Other reaction(s): Other (See Comments) Blurred vision  . Nsaids     Other reaction(s): Unknown  . Omeprazole Magnesium Nausea Only  . Psyllium     Other reaction(s): Unknown  . Sertaconazole     Other reaction(s): Unknown  . Sulfa Antibiotics Rash   Review of Systems  Unable to perform ROS: Dementia    Physical Exam  Constitutional: She appears well-developed.  She appears lethargic. She appears ill.  HENT:  Mouth/Throat: Mucous membranes are dry.  Cardiovascular: Normal rate, regular rhythm and normal heart sounds.   Pulmonary/Chest: She has decreased breath sounds in the right lower field and the left lower field.  Musculoskeletal:  -generlaized weakness and atrophy  Neurological: She appears lethargic. She is disoriented. She displays atrophy.  Skin: Skin is warm and dry.    Vital Signs: BP 131/62 (BP Location: Left Arm)   Pulse 86   Temp 98.3 F (36.8 C) (Oral)   Resp 18   Ht 5\' 1"  (1.549 m)   Wt 65.2 kg (143 lb 12.8 oz)   SpO2 98%   BMI 27.17 kg/m  Pain Assessment: Faces       SpO2: SpO2: 98 % O2 Device:SpO2: 98 % O2 Flow Rate: .   IO: Intake/output summary:  Intake/Output Summary (Last 24 hours) at 03/01/16 1512 Last data filed at 03/01/16 0631  Gross per 24 hour  Intake          1876.04 ml  Output              250 ml  Net          1626.04 ml    LBM: Last BM Date:  (pt unable to verbalize) Baseline Weight: Weight: 68 kg (150 lb) Most recent weight: Weight: 65.2 kg (143 lb 12.8 oz)      Palliative Assessment/Data: 30%   Flowsheet Rows   Flowsheet Row Most Recent Value  Intake Tab  Referral Department  Hospitalist  Unit at Time of Referral  Oncology Unit  Palliative Care Primary Diagnosis  Sepsis/Infectious Disease  Date  Notified  02/28/16  Palliative Care Type  Return patient Palliative Care  Reason for referral  Clarify Goals of Care  Date of Admission  02/28/16  # of days IP prior to Palliative referral  0  Clinical Assessment  Psychosocial & Spiritual Assessment  Palliative Care Outcomes     Discussed with Dr Elisabeth Pigeon Time In: 1600 Time Out: 1515 Time Total: 75 min Greater than 50%  of this time was spent counseling and coordinating care related to the above assessment and plan.  Signed by: Lorinda Creed, NP   Please contact Palliative Medicine Team phone at [phone] for questions and concerns.  For individual provider: See Loretha Stapler

## 2016-03-01 NOTE — Progress Notes (Signed)
Pt's IV leaking this morning.  Per Dr. Elisabeth PigeonVachhani stop fluids and have palliative care see patient.  Lab has tried to get blood from patient multiple times but has not be able to get any.  Made Dr. Elisabeth PigeonVachhani aware that we cannot get another IV or blood at this time.  Dr. Elisabeth PigeonVachhani to call palliative.  Orson Apeanielle Kataleya Zaugg, RN

## 2016-03-01 NOTE — Progress Notes (Signed)
Initial Nutrition Assessment  DOCUMENTATION CODES:   Non-severe (moderate) malnutrition in context of acute illness/injury  INTERVENTION:  If diet able to be advanced, recommend Magic cup TID with meals, each supplement provides 290 kcal and 9 grams of protein.  Will monitor for discussions regarding goals of care.  NUTRITION DIAGNOSIS:   Inadequate oral intake related to lethargy/confusion, dysphagia as evidenced by other (see comment) (per chart).  GOAL:   Patient will meet greater than or equal to 90% of their needs  MONITOR:   PO intake, Supplement acceptance, Labs, Weight trends  REASON FOR ASSESSMENT:   Low Braden    ASSESSMENT:   83 y.o. female has a past medical history significant for recent hospitalization for Influenza A d/c'd to SNF yesterday now with fever, tachycardia, and UTI. Pt is lethargic and non-verbal. Moans to palpation. No family present. Pt is DNR per facility. Unable to eat or take po's.   -Palliative Medicine has been consulted to see patient to discuss goals of care.   Unable to speak with patient as she is lethargic and non-verbal. No family at bedside. Patient is currently NPO awaiting SLP evaluation. Per assessment on 1/17 patient was put on Dysphagia 1 Diet with Nectar-Thick liquids.   Per chart patient was 150 lbs on 1/16. She has lost 7 lbs (4.6% body weight) over 1 week, which is significant for time frame.   Medications reviewed and include: Zithromax, ceftriaxone, Colace, levothyroxine, Tamiflu, 1/2NS @ 100 ml/hr.   Labs reviewed: Sodium 149, Chloride 123, CO2 17, BUN 36.   Nutrition-Focused physical exam completed. Findings are no fat depletion, no muscle depletion, and mild edema.   Diet Order:  Diet NPO time specified Except for: Sips with Meds  Skin:  Wound (see comment) (DTI to coccyx)  Last BM:  Unknown  Height:   Ht Readings from Last 1 Encounters:  02/28/16 5\' 1"  (1.549 m)    Weight:   Wt Readings from Last 1  Encounters:  03/01/16 143 lb 12.8 oz (65.2 kg)    Ideal Body Weight:  47.7 kg  BMI:  Body mass index is 27.17 kg/m.  Estimated Nutritional Needs:   Kcal:  1370-1600 (HBE x 1.2-1.4)  Protein:  65-80 grams (1-1.2 grams/kg)  Fluid:  1.5 L/day (25 ml/kg)  EDUCATION NEEDS:   No education needs identified at this time  Helane RimaLeanne Avary Eichenberger, MS, RD, LDN Pager: [phone] After Hours Pager: [phone]

## 2016-03-01 NOTE — Evaluation (Signed)
Clinical/Bedside Swallow Evaluation Patient Details  Name: Tonye RoyaltyRuby H Meda MRN: [id number] Date of Birth: [birth year]  Today's Date: 03/01/2016 Time: SLP Start Time (ACUTE ONLY): 0800 SLP Stop Time (ACUTE ONLY): 0900 SLP Time Calculation (min) (ACUTE ONLY): 60 min  Past Medical History:  Past Medical History:  Diagnosis Date  . Anxiety   . Dementia   . GERD (gastroesophageal reflux disease)   . Hypertension   . Hypothyroidism   . IBS (irritable bowel syndrome)   . Migraine   . Osteoarthritis    Past Surgical History:  Past Surgical History:  Procedure Laterality Date  . TONSILLECTOMY     HPI:  Pt is a 83 y.o. female has a past medical history significant for Dementia, GERD, Anxiety, IBS and other medical issues per chart who was recently hospitalization for Influenza A. She then d/c'd to SNF now readmitted with fever, tachycardia, and UTI. Pt is lethargic and non-verbal at admission w/ moaning to palpation. No family present at admission. Pt is DNR per facility. Unable to eat or take po's per report. Palliative Care consult has been ordered but not done yet. Pt was recently seen during the previous admission and placed on a dysphagia 1 diet w/ Nectar liquids.     Assessment / Plan / Recommendation Clinical Impression  Pt appears at increased risk for aspiration d/t declined Cognitive status and recent h/o dysphagia (overt coughing) w/ trials of thin liquids during last admission necessitating a Dysphagia level 1 diet w/ Nectar liquids. When given trials of Nectar consistency liquids and purees, pt appeared to immediately tolerate such via cup/straw and tsp w/ no immediate, overt s/s of aspiration noted; no decline in respiratory status following the trials. Pt did exhibit a dry, hacking cough intermittently after trials but unsure if d/t the swallowing of the po trials(aspiration) or the change in laryngeal pressure during epiglottic/airway closure. Oral phase c/b adequate clearing when  given small tsp amounts and alternating b/t foods and liquids. Pt required feeding and verbal/tactile cues intermittently during the feeding of trials. Recommend a Dysphagia level 1 w/ NECTAR consistency liquids; strict aspiration precautions and feeding support at meals. Recommend Pills given in Puree - Crushed as able or in liquid form mixed in puree. ST services will f/u post Palliative Care consult results to initiate the diet if part of POC per family/pt wishes then w/ toleration of diet and education on precautions and consistency of diet w/ family.     Aspiration Risk  Moderate aspiration risk    Diet Recommendation  Dysphagia level 1 w/ Nectar liquids; strict aspiration precautions; feeding support at all meals. Monitor for alertness and respiratory status w/ any oral intake  Medication Administration: Crushed with puree    Other  Recommendations Recommended Consults:  (Dietician f/u) Oral Care Recommendations: Oral care BID;Staff/trained caregiver to provide oral care Other Recommendations: Order thickener from pharmacy;Prohibited food (jello, ice cream, thin soups);Remove water pitcher;Have oral suction available   Follow up Recommendations Skilled Nursing facility (TBD)      Frequency and Duration min 3x week  2 weeks       Prognosis Prognosis for Safe Diet Advancement: Fair Barriers to Reach Goals: Cognitive deficits;Severity of deficits      Swallow Study   General Date of Onset: 02/28/16 HPI: Pt is a 83 y.o. female has a past medical history significant for Dementia, GERD, Anxiety, IBS and other medical issues per chart who was recently hospitalization for Influenza A. She then d/c'd to SNF now readmitted with  fever, tachycardia, and UTI. Pt is lethargic and non-verbal at admission w/ moaning to palpation. No family present at admission. Pt is DNR per facility. Unable to eat or take po's per report. Palliative Care consult has been ordered but not done yet. Pt was recently  seen during the previous admission and placed on a dysphagia 1 diet w/ Nectar liquids.   Type of Study: Bedside Swallow Evaluation Previous Swallow Assessment: 02/24/16 Diet Prior to this Study: Dysphagia 1 (puree);Nectar-thick liquids Temperature Spikes Noted: No (wbc 7.9) Respiratory Status: Room air History of Recent Intubation: No Behavior/Cognition: Confused;Distractible;Requires cueing;Doesn't follow directions Oral Cavity Assessment: Dry Oral Care Completed by SLP: Recent completion by staff Oral Cavity - Dentition: Adequate natural dentition;Missing dentition Vision:  (n/a) Self-Feeding Abilities: Total assist Patient Positioning: Upright in bed Baseline Vocal Quality:  (nonverbal) Volitional Cough: Cognitively unable to elicit Volitional Swallow: Unable to elicit    Oral/Motor/Sensory Function Overall Oral Motor/Sensory Function:  (difficult to assess d/t Cognitive status; wfl w/ boluses)   Ice Chips Ice chips: Not tested   Thin Liquid Thin Liquid: Not tested Other Comments: pt has been on Nectar consistency liquids since last admission earlier this month    Nectar Thick Nectar Thick Liquid: Within functional limits Presentation: Cup;Straw Other Comments: ~3-4 ozs; impulsive drinking behavior requiring monitoring/pinched straw. Pt exhibited a dry, hacking cough intermittently that did not appear immediately related to the po trials but noted.   Honey Thick Honey Thick Liquid: Not tested   Puree Puree: Within functional limits Presentation: Spoon Other Comments: fed; 10 trials. Pt exhibited a dry, hacking cough intermittently post trials that did not appear immediately related to the the trials but noted.    Solid   GO   Solid: Not tested         Jerilynn Som, MS, CCC-SLP Watson,Katherine 03/01/2016,2:33 PM

## 2016-03-02 DIAGNOSIS — Z515 Encounter for palliative care: Secondary | ICD-10-CM

## 2016-03-02 DIAGNOSIS — Z66 Do not resuscitate: Secondary | ICD-10-CM

## 2016-03-02 DIAGNOSIS — R627 Adult failure to thrive: Secondary | ICD-10-CM

## 2016-03-02 DIAGNOSIS — F0391 Unspecified dementia with behavioral disturbance: Secondary | ICD-10-CM

## 2016-03-02 DIAGNOSIS — R6510 Systemic inflammatory response syndrome (SIRS) of non-infectious origin without acute organ dysfunction: Secondary | ICD-10-CM

## 2016-03-02 LAB — BASIC METABOLIC PANEL
ANION GAP: 10 (ref 5–15)
BUN: 19 mg/dL (ref 6–20)
CHLORIDE: 114 mmol/L — AB (ref 101–111)
CO2: 19 mmol/L — AB (ref 22–32)
Calcium: 8.5 mg/dL — ABNORMAL LOW (ref 8.9–10.3)
Creatinine, Ser: 0.62 mg/dL (ref 0.44–1.00)
GFR calc Af Amer: >60 mL/min (ref >60)
Glucose, Bld: 75 mg/dL (ref 65–99)
POTASSIUM: 4.4 mmol/L (ref 3.5–5.1)
Sodium: 143 mmol/L (ref 135–145)

## 2016-03-02 LAB — CULTURE, BLOOD (ROUTINE X 2)

## 2016-03-02 MED ORDER — CEFUROXIME AXETIL 250 MG PO TABS: 250.0000 mg | ORAL_TABLET | Freq: Two times a day (BID) | ORAL | 0 refills | Status: AC

## 2016-03-02 MED ORDER — AZITHROMYCIN 250 MG PO TABS: 250.0000 mg | ORAL_TABLET | Freq: Every day | ORAL | 0 refills | Status: AC

## 2016-03-02 MED ORDER — LORAZEPAM 1 MG PO TABS: 1.0000 mg | ORAL_TABLET | Freq: Four times a day (QID) | ORAL | Status: DC | PRN

## 2016-03-02 MED ORDER — MORPHINE SULFATE (CONCENTRATE) 10 MG/0.5ML PO SOLN: 5.0000 mg | ORAL | Status: DC | PRN

## 2016-03-02 NOTE — Clinical Social Work Note (Addendum)
Patient to be d/c'ed today to [facility].  Patient and family agreeable to plans will transport via ems RN to call report to [phone] room 327.  CSW updated patient's daughter Kerrin Champagneva Kallenberger [phone].  Windell MouldingEric Kayleanna Lorman, MSW, Theresia MajorsLCSWA [phone]

## 2016-03-02 NOTE — [place] FL2 (Signed)
[place] MEDICAID FL2 LEVEL OF CARE SCREENING TOOL     IDENTIFICATION  Patient Name: Regina Fields Birthdate: Apr 18, 1928 Sex: female Admission Date (Current Location): 02/28/2016  [place]   Facility and Address:  [facility], [address], [place], [place] [address]      Provider Number: [id number]  Attending Physician Name and Address:  Altamese DillingVaibhavkumar Avi Kerschner, MD  Relative Name and Phone Number:  Marisa SprinklesSMITH,ANN M  [phone]  Esmeralda LinksGriffin,Jeff  [phone]  [phone] or Hosman,Eva Daughter   [phone]     Current Level of Care: Hospital Recommended Level of Care: Skilled Nursing Facility Prior Approval Number:    Date Approved/Denied:   PASRR Number: [id number]  Discharge Plan: SNF    Current Diagnoses: Patient Active Problem List   Diagnosis Date Noted  . DNR (do not resuscitate) 03/02/2016  . Palliative care by specialist   . Malnutrition of moderate degree 03/01/2016  . Pressure injury of skin 02/29/2016  . SIRS (systemic inflammatory response syndrome) (HCC) 02/28/2016  . UTI (urinary tract infection) 02/28/2016  . Acute encephalopathy 02/28/2016  . Failure to thrive in adult 02/28/2016  . Sepsis (HCC) 02/24/2016  . Dehydration   . Palliative care encounter   . Goals of care, counseling/discussion   . Altered mental status   . Encounter for hospice care discussion   . Hypernatremia 01/10/2016  . Healthcare-associated pneumonia 03/09/2015  . HTN (hypertension) 03/09/2015  . Dementia with behavioral disturbance 03/09/2015  . Dyslipidemia 03/09/2015  . HCAP (healthcare-associated pneumonia) 03/09/2015  . Acute respiratory failure (HCC) 03/01/2015    Orientation RESPIRATION BLADDER Height & Weight     Self  Normal, O2 Incontinent Weight: 150 lb 11.2 oz (68.4 kg) Height:  5\' 1"  (154.9 cm)  BEHAVIORAL SYMPTOMS/MOOD NEUROLOGICAL BOWEL NUTRITION STATUS      Incontinent Diet  AMBULATORY STATUS  COMMUNICATION OF NEEDS Skin   Total Care Verbally Normal                       Personal Care Assistance Level of Assistance  Total care       Total Care Assistance: Maximum assistance   Functional Limitations Info             SPECIAL CARE FACTORS FREQUENCY                       Contractures Contractures Info: Not present    Additional Factors Info  Code Status, Allergies Code Status Info: DNR Allergies Info: ALENDRONATE, CANDESARTAN, AMOXICILLIN, ATORVASTATIN, CEFTRIAXONE, LEVOFLOXACIN, LISINOPRIL, LOSARTAN, NIZATIDINE, NSAIDS, OMEPRAZOLE MAGNESIUM, PSYLLIUM, SERTACONAZOLE, SULFA ANTIBIOTICS           Current Medications (03/02/2016):  This is the current hospital active medication list Current Facility-Administered Medications  Medication Dose Route Frequency Provider Last Rate Last Dose  . acetaminophen (TYLENOL) tablet 650 mg  650 mg Oral Q6H PRN Marguarite ArbourJeffrey D Sparks, MD       Or  . acetaminophen (TYLENOL) suppository 650 mg  650 mg Rectal Q6H PRN Marguarite ArbourJeffrey D Sparks, MD      . aspirin chewable tablet 81 mg  81 mg Oral Daily Marguarite ArbourJeffrey D Sparks, MD   81 mg at 03/02/16 0933  . azithromycin (ZITHROMAX) tablet 250 mg  250 mg Oral QPC supper Altamese DillingVaibhavkumar Shamicka Inga, MD   250 mg at 03/01/16 1713  . bisacodyl (DULCOLAX) suppository 10 mg  10 mg Rectal Daily PRN Marguarite ArbourJeffrey D Sparks, MD      .  bisacodyl (DULCOLAX) suppository 10 mg  10 mg Rectal Daily PRN Marguarite Arbour, MD   10 mg at 03/01/16 1842  . cefUROXime (CEFTIN) tablet 250 mg  250 mg Oral BID WC Altamese Dilling, MD   250 mg at 03/02/16 0933  . docusate sodium (COLACE) capsule 100 mg  100 mg Oral BID Marguarite Arbour, MD   100 mg at 03/02/16 0933  . ipratropium-albuterol (DUONEB) 0.5-2.5 (3) MG/3ML nebulizer solution 3 mL  3 mL Nebulization Q6H PRN Marguarite Arbour, MD      . ipratropium-albuterol (DUONEB) 0.5-2.5 (3) MG/3ML nebulizer solution 3 mL  3 mL Nebulization TID Altamese Dilling, MD   3 mL at 03/02/16  1411  . levothyroxine (SYNTHROID, LEVOTHROID) tablet 137 mcg  137 mcg Oral QAC breakfast Marguarite Arbour, MD   137 mcg at 03/02/16 0933  . LORazepam (ATIVAN) tablet 1 mg  1 mg Oral Q6H PRN Canary Brim, NP      . MEDLINE mouth rinse  15 mL Mouth Rinse q12n4p Marguarite Arbour, MD   15 mL at 03/02/16 1209  . morphine CONCENTRATE 10 MG/0.5ML oral solution 5 mg  5 mg Oral Q2H PRN Canary Brim, NP      . ondansetron [facility]) tablet 4 mg  4 mg Oral Q6H PRN Marguarite Arbour, MD       Or  . ondansetron [facility]) injection 4 mg  4 mg Intravenous Q6H PRN Marguarite Arbour, MD      . oseltamivir (TAMIFLU) capsule 30 mg  30 mg Oral BID Marguarite Arbour, MD   30 mg at 03/02/16 1610     Discharge Medications: Please see discharge summary for a list of discharge medications.  Relevant Imaging Results:  Relevant Lab Results:   Additional Information SSN [id number]  Darleene Cleaver, [place]

## 2016-03-02 NOTE — Progress Notes (Signed)
Report called to Senaida OresPaulina, Charity fundraiserN at [facility] and EMS contacted for transport.  Evlyn Kanneranielle Cammie Faulstich,RN

## 2016-03-02 NOTE — Progress Notes (Signed)
New referral for hospice at [facility] for 83yo female who was admitted to [facility] on 1[facility]20.18 with sepsis, UTI, SIRS and altered mental status.  Patient has past medical history of dementia, GERD, HTN, hypothyrodism, IBS, osteoarthritis and anxiety.  Post palliative medicine consult, the family- who is not at the bedside but in [place]- request to make the patient full comfort care with no further aggressive treatment and no further hospital admission.  The patient will plan on discharging back [facility] where she resides in LTC today with hospice services.  No equipment need identified.  Family request same day admission.  All information faxed to referral intake.  Thank you for allowing participation in this patient's care.   Note- patient is in our system with pending referral from her PCP on 12.7.17 but was not evaluated due to family not able to make a decision to proceed with hospice services.    Norris CrossKara H. Marshall, RN Clinical Nurse Liaison [facility] [phone]

## 2016-03-02 NOTE — Discharge Summary (Signed)
[facility] [place] [facility]   PATIENT NAME: Regina Fields    MR#:  [id number]  DATE OF BIRTH:  February 14, 1928  DATE OF ADMISSION:  02/28/2016 ADMITTING PHYSICIAN: Marguarite Arbour, MD  DATE OF DISCHARGE: 03/02/2016  PRIMARY CARE PHYSICIAN: No PCP Per Patient    ADMISSION DIAGNOSIS:  Sepsis, due to unspecified organism (HCC) [A41.9] Urinary tract infection without hematuria, site unspecified [N39.0] Altered mental status, unspecified altered mental status type [R41.82]  DISCHARGE DIAGNOSIS:  Principal Problem:   SIRS (systemic inflammatory response syndrome) (HCC) Active Problems:   UTI (urinary tract infection)   Acute encephalopathy   Failure to thrive in adult   Pressure injury of skin   Malnutrition of moderate degree  SECONDARY DIAGNOSIS:   Past Medical History:  Diagnosis Date  . Anxiety   . Dementia   . GERD (gastroesophageal reflux disease)   . Hypertension   . Hypothyroidism   . IBS (irritable bowel syndrome)   . Migraine   . Osteoarthritis     HOSPITAL COURSE:   Principal Problem:   SIRS (systemic inflammatory response syndrome) (HCC) Active Problems:   UTI (urinary tract infection)   Acute encephalopathy   Failure to thrive in adult   Pressure injury of skin   Malnutrition of moderate degree   * SIRS- UTI, Altered mental status. Recent Influenza.   Bacteremia? It seems to be contamination   Failure to thrive,    More alert today, but confused. Non verbal.   IV ABx, IV fluids, - lost her IV line , so switched to oral.   Keep NPO, SLP eval appreciated- Dysphagia 1 diet- started.   Pallaitive care consult.   arranged for SNF with care for hospice.   DISCHARGE CONDITIONS:   Stable.  CONSULTS OBTAINED:    DRUG ALLERGIES:   Allergies  Allergen Reactions  . Alendronate Shortness Of Breath  . Candesartan Shortness Of Breath  . Amoxicillin Diarrhea  . Atorvastatin     Other reaction(s): Muscle Pain  .  Ceftriaxone     Other reaction(s): Muscle Pain  . Levofloxacin     Other reaction(s): Unknown  . Lisinopril     Other reaction(s): Unknown  . Losartan     Other reaction(s): Unknown  . Nizatidine     Other reaction(s): Other (See Comments) Blurred vision  . Nsaids     Other reaction(s): Unknown  . Omeprazole Magnesium Nausea Only  . Psyllium     Other reaction(s): Unknown  . Sertaconazole     Other reaction(s): Unknown  . Sulfa Antibiotics Rash    DISCHARGE MEDICATIONS:   Current Discharge Medication List    START taking these medications   Details  azithromycin (ZITHROMAX) 250 MG tablet Take 1 tablet (250 mg total) by mouth daily. Qty: 3 each, Refills: 0    cefUROXime (CEFTIN) 250 MG tablet Take 1 tablet (250 mg total) by mouth 2 (two) times daily with a meal. Qty: 6 tablet, Refills: 0      CONTINUE these medications which have NOT CHANGED   Details  acetaminophen (TYLENOL) 325 MG tablet Take 650 mg by mouth 3 (three) times daily as needed for mild pain.     albuterol (PROVENTIL HFA;VENTOLIN HFA) 108 (90 Base) MCG/ACT inhaler Inhale 2 puffs into the lungs every 6 (six) hours as needed for wheezing or shortness of breath. Qty: 1 Inhaler, Refills: 0    aspirin 81 MG chewable tablet Chew 81 mg by mouth daily.  bisacodyl (DULCOLAX) 10 MG suppository Place 1 suppository (10 mg total) rectally daily as needed for moderate constipation. Qty: 12 suppository, Refills: 0    ipratropium-albuterol (DUONEB) 0.5-2.5 (3) MG/3ML SOLN Take 3 mLs by nebulization every 6 (six) hours. Qty: 360 mL    levothyroxine (SYNTHROID, LEVOTHROID) 137 MCG tablet Take 137 mcg by mouth daily. @ 2000    montelukast (SINGULAIR) 10 MG tablet Take 10 mg by mouth every evening.    mouth rinse LIQD solution 15 mLs by Mouth Rinse route 2 times daily at 12 noon and 4 pm. Qty: 118 mL, Refills: 0    Multiple Vitamin (THEREMS) TABS Take 1 tablet by mouth daily.    phenazopyridine (PYRIDIUM) 100 MG  tablet Take 100 mg by mouth at bedtime as needed for pain.    polyethylene glycol (MIRALAX / GLYCOLAX) packet Take 17 g by mouth at bedtime.    traMADol (ULTRAM) 50 MG tablet Take 1 tablet (50 mg total) by mouth every 6 (six) hours as needed for moderate pain. Qty: 30 tablet, Refills: 0      STOP taking these medications     oseltamivir (TAMIFLU) 30 MG capsule          DISCHARGE INSTRUCTIONS:    Follow with PMD aas needed. Hospice care at SNF.  If you experience worsening of your admission symptoms, develop shortness of breath, life threatening emergency, suicidal or homicidal thoughts you must seek medical attention immediately by calling 911 or calling your MD immediately  if symptoms less severe.  You Must read complete instructions/literature along with all the possible adverse reactions/side effects for all the Medicines you take and that have been prescribed to you. Take any new Medicines after you have completely understood and accept all the possible adverse reactions/side effects.   Please note  You were cared for by a hospitalist during your hospital stay. If you have any questions about your discharge medications or the care you received while you were in the hospital after you are discharged, you can call the unit and asked to speak with the hospitalist on call if the hospitalist that took care of you is not available. Once you are discharged, your primary care physician will handle any further medical issues. Please note that NO REFILLS for any discharge medications will be authorized once you are discharged, as it is imperative that you return to your primary care physician (or establish a relationship with a primary care physician if you do not have one) for your aftercare needs so that they can reassess your need for medications and monitor your lab values.    Today   CHIEF COMPLAINT:   Chief Complaint  Patient presents with  . Shortness of Breath  . Influenza     HISTORY OF PRESENT ILLNESS:  Regina Fields  is a 83 y.o. female with a known history of recent hospitalization for Influenza A d/c'd to SNF yesterday now with fever, tachycardia, and UTI. Pt is lethargic and non-verbal. Moans to palpation. No family present. Pt is DNR per facility. Unable to eat or take po's. Palliative Care consult has been ordered but not done yet.   VITAL SIGNS:  Blood pressure (!) 112/37, pulse 86, temperature 97.5 F (36.4 C), temperature source Oral, resp. rate 16, height 5\' 1"  (1.549 m), weight 68.4 kg (150 lb 11.2 oz), SpO2 97 %.  I/O:   Intake/Output Summary (Last 24 hours) at 03/02/16 1038 Last data filed at 03/02/16 0550  Gross per 24 hour  Intake               60 ml  Output              750 ml  Net             -690 ml    PHYSICAL EXAMINATION:   GENERAL:  83 y.o.-year-old patient lying in the bed with no acute distress.  EYES: Pupils equal, round, reactive to light and accommodation. No scleral icterus. Extraocular muscles intact.  HEENT: Head atraumatic, normocephalic. Oropharynx and nasopharynx clear.  NECK:  Supple, no jugular venous distention. No thyroid enlargement, no tenderness.  LUNGS: Normal breath sounds bilaterally, no wheezing, rales,rhonchi or crepitation. No use of accessory muscles of respiration.  CARDIOVASCULAR: S1, S2 normal. No murmurs, rubs, or gallops.  ABDOMEN: Soft, nontender, nondistended. Bowel sounds present. No organomegaly or mass.  EXTREMITIES: No pedal edema, cyanosis, or clubbing.  NEUROLOGIC: Pt is alert but confused and non communicative much. Moves some to stimuli, and speak some words, but no communications. PSYCHIATRIC: The patient is confused.  SKIN: No obvious rash, lesion, or ulcer.   DATA REVIEW:   CBC  Recent Labs Lab 02/29/16 0455  WBC 7.9  HGB 9.8*  HCT 29.7*  PLT 109*    Chemistries   Recent Labs Lab 02/29/16 0455 03/02/16 0408  NA 149* 143  K 4.5 4.4  CL 123* 114*  CO2 17* 19*   GLUCOSE 101* 75  BUN 36* 19  CREATININE 0.95 0.62  CALCIUM 8.3* 8.5*  AST 50*  --   ALT 60*  --   ALKPHOS 54  --   BILITOT 0.9  --     Cardiac Enzymes  Recent Labs Lab 02/28/16 1402  TROPONINI <0.03    Microbiology Results  Results for orders placed or performed during the hospital encounter of 02/28/16  Blood Culture (routine x 2)     Status: Abnormal   Collection Time: 02/28/16  2:10 PM  Result Value Ref Range Status   Specimen Description BLOOD RIGHT HAND  Final   Special Requests   Final    BOTTLES DRAWN AEROBIC AND ANAEROBIC AER10ML ANA10ML   Culture  Setup Time   Final    ANAEROBIC BOTTLE ONLY GRAM POSITIVE COCCI CRITICAL RESULT CALLED TO, READ BACK BY AND VERIFIED WITH: Crist Fat [phone] [id number] [facility]    Culture (A)  Final    STAPHYLOCOCCUS SPECIES (COAGULASE NEGATIVE) SUSCEPTIBILITIES PERFORMED ON PREVIOUS CULTURE WITHIN THE LAST 5 DAYS. Performed at [facility], [address] [place], [place] [address]    Report Status 03/02/2016 FINAL  Final  Blood Culture ID Panel (Reflexed)     Status: Abnormal   Collection Time: 02/28/16  2:10 PM  Result Value Ref Range Status   Enterococcus species NOT DETECTED NOT DETECTED Final   Listeria monocytogenes NOT DETECTED NOT DETECTED Final   Staphylococcus species DETECTED (A) NOT DETECTED Final    Comment: CRITICAL RESULT CALLED TO, READ BACK BY AND VERIFIED WITH: Crist Fat ON 02/29/16 AT 1138 [facility]    Staphylococcus aureus NOT DETECTED NOT DETECTED Final   Methicillin resistance DETECTED (A) NOT DETECTED Final    Comment: CRITICAL RESULT CALLED TO, READ BACK BY AND VERIFIED WITH: Crist Fat ON 02/29/16 AT 1138 [facility]    Streptococcus species NOT DETECTED NOT DETECTED Final   Streptococcus agalactiae NOT DETECTED NOT DETECTED Final   Streptococcus pneumoniae NOT DETECTED NOT DETECTED Final   Streptococcus pyogenes NOT DETECTED NOT DETECTED Final  Acinetobacter baumannii NOT DETECTED NOT DETECTED Final    Enterobacteriaceae species NOT DETECTED NOT DETECTED Final   Enterobacter cloacae complex NOT DETECTED NOT DETECTED Final   Escherichia coli NOT DETECTED NOT DETECTED Final   Klebsiella oxytoca NOT DETECTED NOT DETECTED Final   Klebsiella pneumoniae NOT DETECTED NOT DETECTED Final   Proteus species NOT DETECTED NOT DETECTED Final   Serratia marcescens NOT DETECTED NOT DETECTED Final   Haemophilus influenzae NOT DETECTED NOT DETECTED Final   Neisseria meningitidis NOT DETECTED NOT DETECTED Final   Pseudomonas aeruginosa NOT DETECTED NOT DETECTED Final   Candida albicans NOT DETECTED NOT DETECTED Final   Candida glabrata NOT DETECTED NOT DETECTED Final   Candida krusei NOT DETECTED NOT DETECTED Final   Candida parapsilosis NOT DETECTED NOT DETECTED Final   Candida tropicalis NOT DETECTED NOT DETECTED Final  Blood Culture (routine x 2)     Status: Abnormal   Collection Time: 02/28/16  2:11 PM  Result Value Ref Range Status   Specimen Description BLOOD RIGHT ARM  Final   Special Requests BOTTLES DRAWN AEROBIC AND ANAEROBIC ANA6ML AER10ML  Final   Culture  Setup Time   Final    GRAM POSITIVE COCCI AEROBIC BOTTLE ONLY CRITICAL VALUE NOTED.  VALUE IS CONSISTENT WITH PREVIOUSLY REPORTED AND CALLED VALUE.    Culture STAPHYLOCOCCUS SPECIES (COAGULASE NEGATIVE) (A)  Final   Report Status 03/02/2016 FINAL  Final   Organism ID, Bacteria STAPHYLOCOCCUS SPECIES (COAGULASE NEGATIVE)  Final      Susceptibility   Staphylococcus species (coagulase negative) - MIC*    CIPROFLOXACIN >=8 RESISTANT Resistant     ERYTHROMYCIN >=8 RESISTANT Resistant     GENTAMICIN 4 SENSITIVE Sensitive     OXACILLIN 1 RESISTANT Resistant     TETRACYCLINE >=16 RESISTANT Resistant     VANCOMYCIN <=0.5 SENSITIVE Sensitive     TRIMETH/SULFA 80 RESISTANT Resistant     CLINDAMYCIN <=0.25 RESISTANT Resistant     RIFAMPIN <=0.5 SENSITIVE Sensitive     Inducible Clindamycin POSITIVE Resistant     * STAPHYLOCOCCUS SPECIES  (COAGULASE NEGATIVE)  Urine culture     Status: None   Collection Time: 02/28/16  2:57 PM  Result Value Ref Range Status   Specimen Description URINE, RANDOM  Final   Special Requests NONE  Final   Culture   Final    NO GROWTH Performed at [facility], [address] [place], [place] [address]    Report Status 02/29/2016 FINAL  Final    RADIOLOGY:  No results found.  EKG:   Orders placed or performed during the hospital encounter of 02/28/16  . ED EKG  . ED EKG  . ED EKG 12-Lead  . ED EKG 12-Lead  . EKG 12-Lead  . EKG 12-Lead      Management plans discussed with the patient, family and they are in agreement.  CODE STATUS:     Code Status Orders        Start     Ordered   02/28/16 1813  Do not attempt resuscitation (DNR)  Continuous    Question Answer Comment  In the event of cardiac or respiratory ARREST Do not call a "code blue"   In the event of cardiac or respiratory ARREST Do not perform Intubation, CPR, defibrillation or ACLS   In the event of cardiac or respiratory ARREST Use medication by any route, position, wound care, and other measures to relive pain and suffering. May use oxygen, suction and manual treatment of  airway obstruction as needed for comfort.      02/28/16 1812    Code Status History    Date Active Date Inactive Code Status Order ID Comments User Context   02/24/2016 10:11 AM 02/27/2016  6:06 PM DNR [id number]  Adrian Saran, MD ED   01/11/2016 12:33 PM 01/14/2016  4:21 PM DNR [id number]  Shaune Pollack, MD Inpatient   01/10/2016  1:23 PM 01/11/2016 12:33 PM Full Code [id number]  Milagros Loll, MD ED   03/10/2015 12:16 AM 03/13/2015  7:28 PM Full Code [id number]  Gery Pray, MD ED   03/01/2015  6:51 PM 03/05/2015  4:42 PM Full Code [id number]  Katha Hamming, MD ED    Advance Directive Documentation   Flowsheet Row Most Recent Value  Type of Advance Directive  Out of facility DNR (pink MOST or yellow form)  Pre-existing out of facility DNR order  (yellow form or pink MOST form)  No data  "MOST" Form in Place?  No data      TOTAL TIME TAKING CARE OF THIS PATIENT: 35 minutes.    Altamese Dilling M.D on 03/02/2016 at 10:38 AM  Between 7am to 6pm - Pager - [phone]  After 6pm go to [email or web address] - Social research officer, government  [facility]  Office  [phone]  CC: Primary care physician; No PCP Per Patient   Note: This dictation was prepared with Dragon dictation along with smaller phrase technology. Any transcriptional errors that result from this process are unintentional.

## 2016-03-02 NOTE — Progress Notes (Signed)
Daily Progress Note   Patient Name: Regina Fields       Date: 03/02/2016 DOB: [birth year]  Age: 83 y.o. MRN#: [id number] Attending Physician: Altamese Dilling, MD Primary Care Physician: No PCP Per Patient Admit Date: 02/28/2016  Reason for Consultation/Follow-up: Establishing goals of care and Psychosocial/spiritual support 2/2 to es-dementia and its trajectory  Subjective:  - continued conversation regarding diagnosis, prognosis, GOC, EOL wishes disposition and options.  Son/HPOA lives in [place], today conversation was on phone at bedside   A detailed discussion was had again with both son and his wife on two separate occasions today regarding advanced directives.  Concepts specific to code status, artifical feeding and hydration, continued IV antibiotics and rehospitalization was had.  The difference between a aggressive medical intervention path  and a palliative comfort care path for this patient at this time was had.  Values and goals of care important to patient and family were attempted to be elicited.    Hospice benefit was detailed  Natural trajectory and expectations at EOL were discussed.  Questions and concerns addressed.   Family encouraged to call with questions or concerns.  PMT will continue to support holistically.   Length of Stay: 3  Current Medications: Scheduled Meds:  . aspirin  81 mg Oral Daily  . azithromycin  250 mg Oral QPC supper  . cefUROXime  250 mg Oral BID WC  . docusate sodium  100 mg Oral BID  . ipratropium-albuterol  3 mL Nebulization TID  . levothyroxine  137 mcg Oral QAC breakfast  . mouth rinse  15 mL Mouth Rinse q12n4p  . oseltamivir  30 mg Oral BID    Continuous Infusions:   PRN Meds: acetaminophen **OR** acetaminophen, bisacodyl,  bisacodyl, ipratropium-albuterol, morphine injection, ondansetron **OR** ondansetron (ZOFRAN) IV  Physical Exam  Constitutional: She appears lethargic. She appears ill.  HENT:  Mouth/Throat: Mucous membranes are dry.  Cardiovascular: Normal rate, regular rhythm and normal heart sounds.   Pulmonary/Chest: She has decreased breath sounds in the right lower field and the left lower field.  Abdominal: Soft. Bowel sounds are normal.  Neurological: She appears lethargic.  - confused and easily agitated  Skin: Skin is warm and dry.            Vital Signs: BP (!) 112/37 (BP Location: Right Arm)  Pulse 86   Temp 97.5 F (36.4 C) (Oral)   Resp 16   Ht 5\' 1"  (1.549 m)   Wt 68.4 kg (150 lb 11.2 oz)   SpO2 97%   BMI 28.47 kg/m  SpO2: SpO2: 97 % O2 Device: O2 Device: Not Delivered O2 Flow Rate:    Intake/output summary:  Intake/Output Summary (Last 24 hours) at 03/02/16 1158 Last data filed at 03/02/16 0550  Gross per 24 hour  Intake               60 ml  Output              750 ml  Net             -690 ml   LBM: Last BM Date:  (pt unable to verbalize) Baseline Weight: Weight: 68 kg (150 lb) Most recent weight: Weight: 68.4 kg (150 lb 11.2 oz)       Palliative Assessment/Data: 20 % at best   Flowsheet Rows   Flowsheet Row Most Recent Value  Intake Tab  Referral Department  Hospitalist  Unit at Time of Referral  Oncology Unit  Palliative Care Primary Diagnosis  Sepsis/Infectious Disease  Date Notified  02/28/16  Palliative Care Type  Return patient Palliative Care  Reason for referral  Clarify Goals of Care  Date of Admission  02/28/16  # of days IP prior to Palliative referral  0  Clinical Assessment  Psychosocial & Spiritual Assessment  Palliative Care Outcomes      Patient Active Problem List   Diagnosis Date Noted  . Malnutrition of moderate degree 03/01/2016  . Pressure injury of skin 02/29/2016  . SIRS (systemic inflammatory response syndrome) (HCC) 02/28/2016    . UTI (urinary tract infection) 02/28/2016  . Acute encephalopathy 02/28/2016  . Failure to thrive in adult 02/28/2016  . Sepsis (HCC) 02/24/2016  . Dehydration   . Palliative care encounter   . Goals of care, counseling/discussion   . Altered mental status   . Encounter for hospice care discussion   . Hypernatremia 01/10/2016  . Healthcare-associated pneumonia 03/09/2015  . HTN (hypertension) 03/09/2015  . Dementia 03/09/2015  . Dyslipidemia 03/09/2015  . HCAP (healthcare-associated pneumonia) 03/09/2015  . Acute respiratory failure (HCC) 03/01/2015    Palliative Care Assessment & Plan    Assessment:  - Continued physical, functional and cognitive decline 2/2 to ES-dementia as patient transitions at EOL.   Prognosis is likely weeks.  Recommendations/Plan:     Comfort and dignity is the priority of care  Symptom management    Dyspnea/Pain: Roxanol 5 mg po/sl every 1 hr prn  Agitation: Ativan    At family request continue oral antibiotics/antivirals as long as patient is able   Disposition back to [facility] with Hospice services   Goals of Care and Additional Recommendations:  Limitations on Scope of Treatment: Avoid Hospitalization, Full Comfort Care, Minimize Medications, No Artificial Feeding, No Blood Transfusions, No Diagnostics, No Glucose Monitoring, No IV Antibiotics, No IV Fluids and No Lab Draws  Code Status:    Code Status Orders        Start     Ordered   02/28/16 1813  Do not attempt resuscitation (DNR)  Continuous    Question Answer Comment  In the event of cardiac or respiratory ARREST Do not call a "code blue"   In the event of cardiac or respiratory ARREST Do not perform Intubation, CPR, defibrillation or ACLS   In the event of cardiac  or respiratory ARREST Use medication by any route, position, wound care, and other measures to relive pain and suffering. May use oxygen, suction and manual treatment of airway obstruction as needed for comfort.       02/28/16 1812    Code Status History    Date Active Date Inactive Code Status Order ID Comments User Context   02/24/2016 10:11 AM 02/27/2016  6:06 PM DNR [id number]  Adrian SaranSital Mody, MD ED   01/11/2016 12:33 PM 01/14/2016  4:21 PM DNR [id number]  Shaune PollackQing Chen, MD Inpatient   01/10/2016  1:23 PM 01/11/2016 12:33 PM Full Code [id number]  Milagros LollSrikar Sudini, MD ED   03/10/2015 12:16 AM 03/13/2015  7:28 PM Full Code [id number]  Gery Prayebby Crosley, MD ED   03/01/2015  6:51 PM 03/05/2015  4:42 PM Full Code [id number]  Katha HammingSnehalatha Konidena, MD ED    Advance Directive Documentation   Flowsheet Row Most Recent Value  Type of Advance Directive  Out of facility DNR (pink MOST or yellow form)  Pre-existing out of facility DNR order (yellow form or pink MOST form)  No data  "MOST" Form in Place?  No data       Prognosis:   < 4 weeks  Discharge Planning:  Back to [facility] with Hospice services in place, avoid re-hospitalization Care plan was discussed with Dr Elisabeth PigeonVachhani  Thank you for allowing the Palliative Medicine Team to assist in the care of this patient.   Time In: 0845 Time Out: 0930 Total Time 45 min Prolonged Time Billed  no       Greater than 50%  of this time was spent counseling and coordinating care related to the above assessment and plan.  Lorinda CreedLARACH, Billijo Dilling, NP  Please contact Palliative Medicine Team phone at [phone] for questions and concerns.

## 2016-03-02 NOTE — Progress Notes (Signed)
Pt transported to [facility] via EMS.

## 2016-03-02 NOTE — Discharge Instructions (Signed)
Hospice to follow in SNF.

## 2016-08-08 DEATH — deceased

## 2017-04-09 IMAGING — CT CT HEAD W/O CM
3 series · 15 of 44 positions shown, 18 images · non-contrast
Comparison: No priors.

CLINICAL DATA: 87-year-old female with history of dementia. Altered
mental status.

EXAM:
CT HEAD WITHOUT CONTRAST
TECHNIQUE: Contiguous axial images were obtained from the base of the skull
through the vertex without intravenous contrast.

[Series 2: head wo · axial · 0.41mm/px · z∈[-167,-57]mm · 9 of 27 slices shown, 12 images]
[im 3/27  brain]
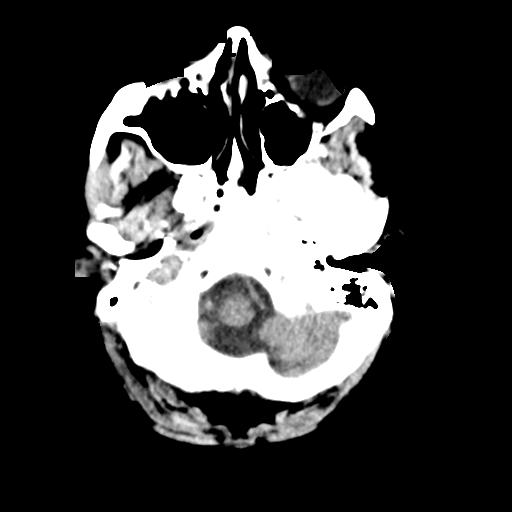
[im 3/27  bone]
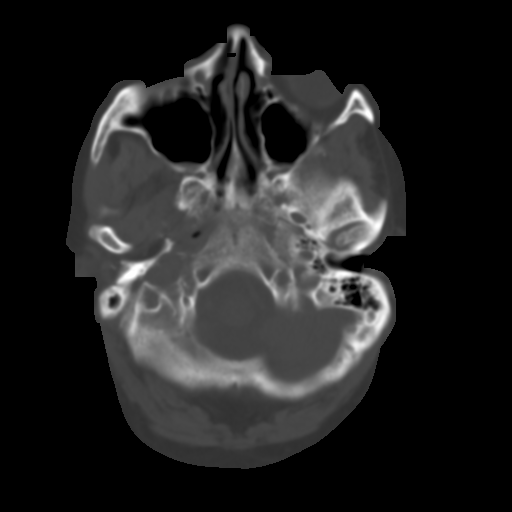
[im 6/27  brain]
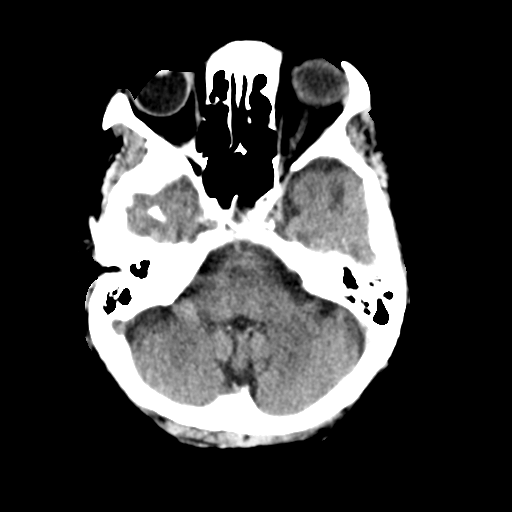
[im 8/27  brain]
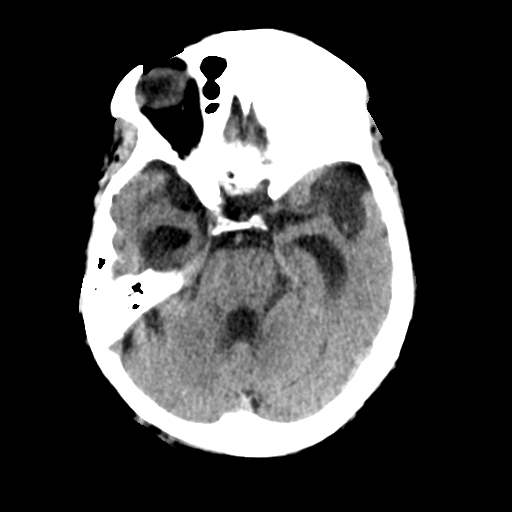
[im 11/27  brain]
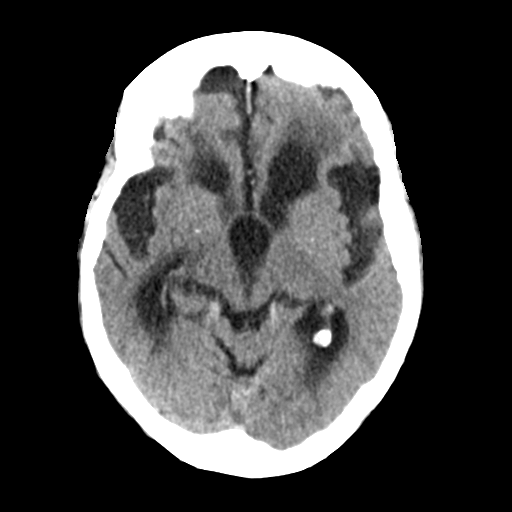
[im 14/27  brain]
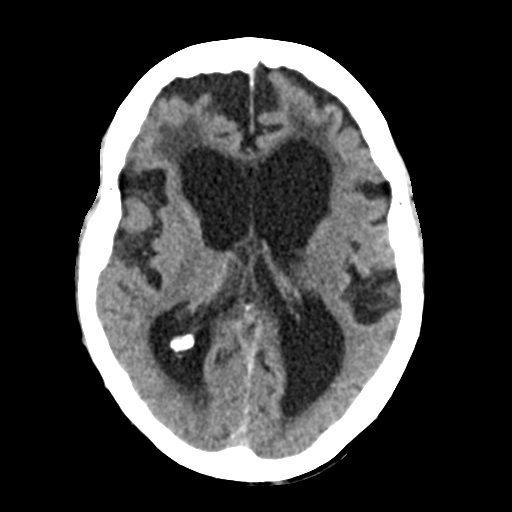
[im 14/27  bone]
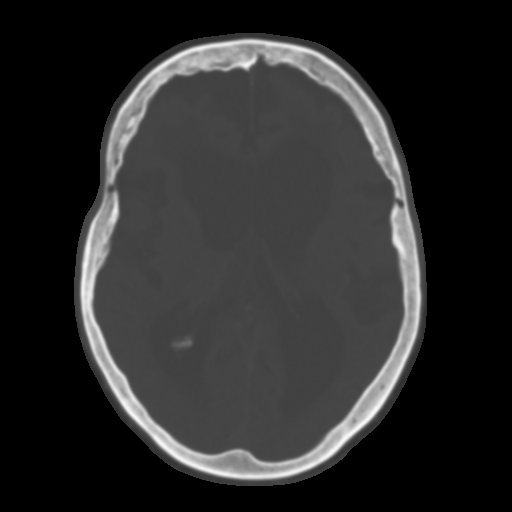
[im 17/27  brain]
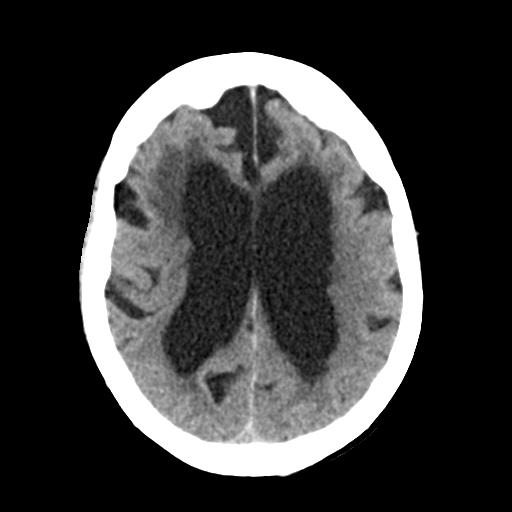
[im 20/27  brain]
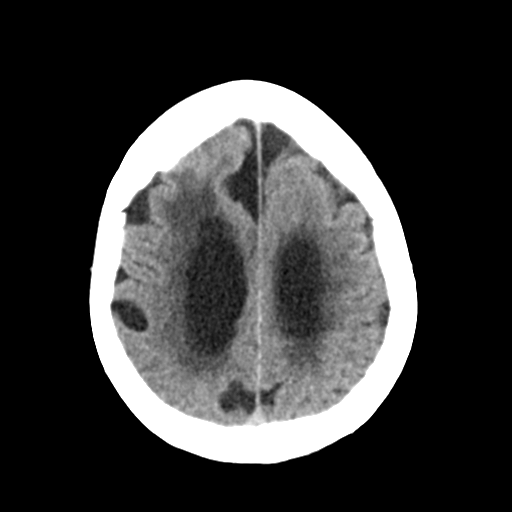
[im 22/27  brain]
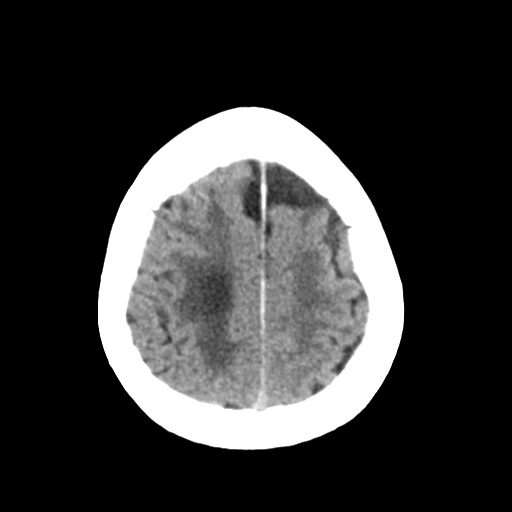
[im 25/27  brain]
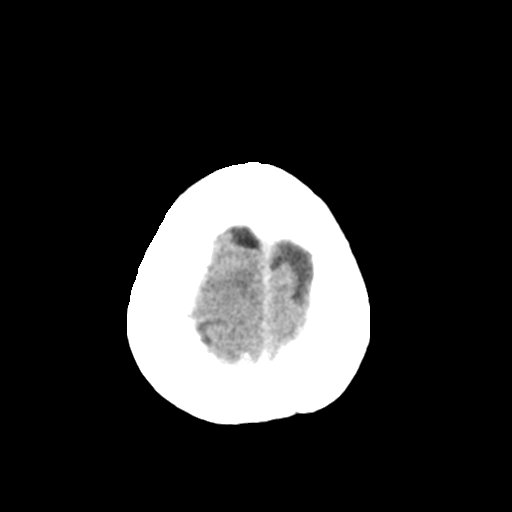
[im 25/27  bone]
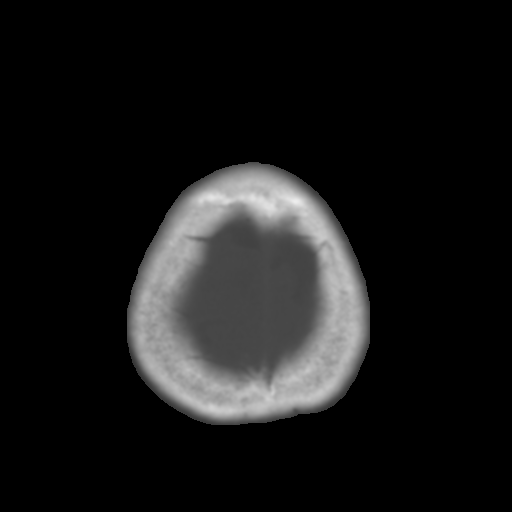

[Series 4: coronal soft tissue · coronal · 0.29mm/px · 3 of 65 slices shown]
[im 22/65  brain]
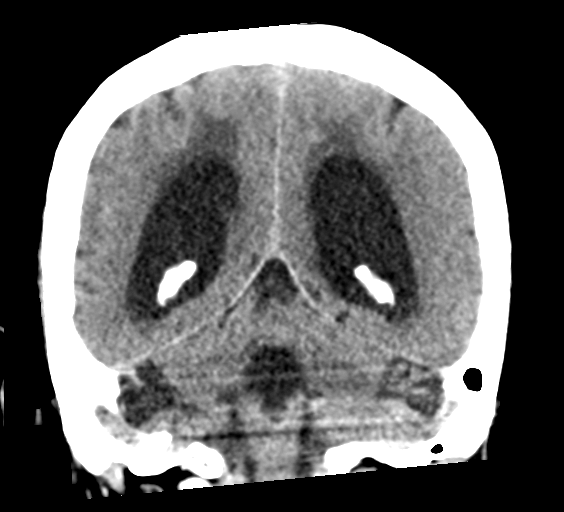
[im 29/65  brain]
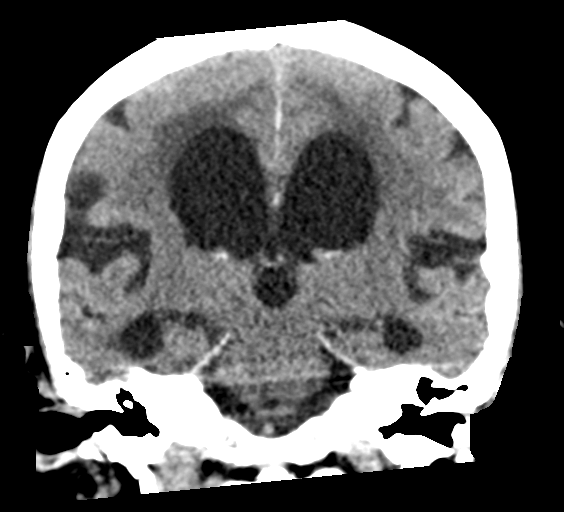
[im 36/65  brain]
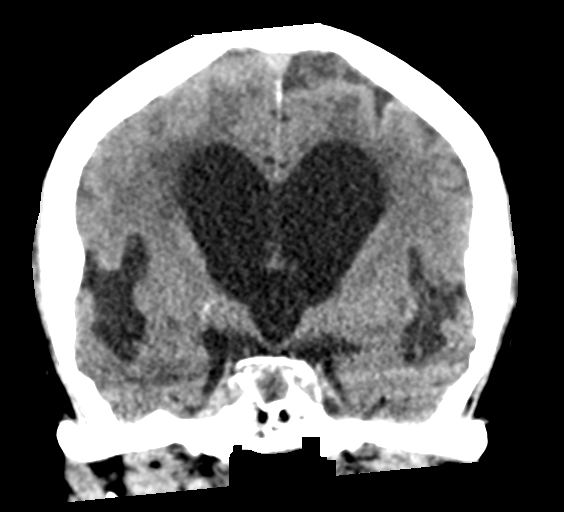

[Series 5: sagittal soft tissue · sagittal · 0.29mm/px · 3 of 54 slices shown]
[im 18/54  brain]
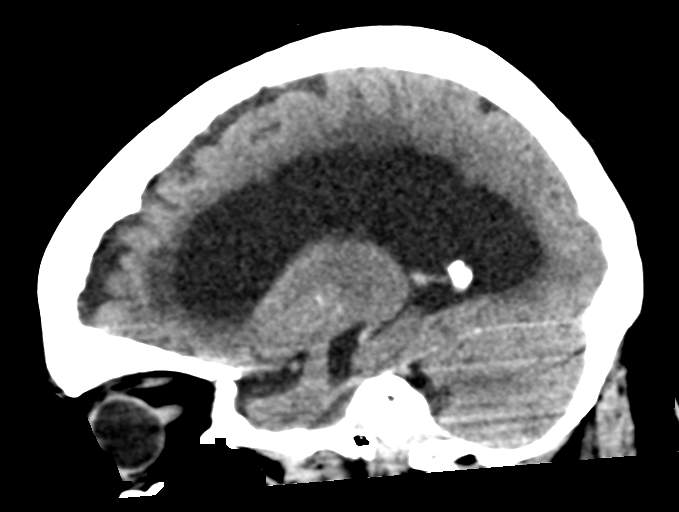
[im 27/54  brain]
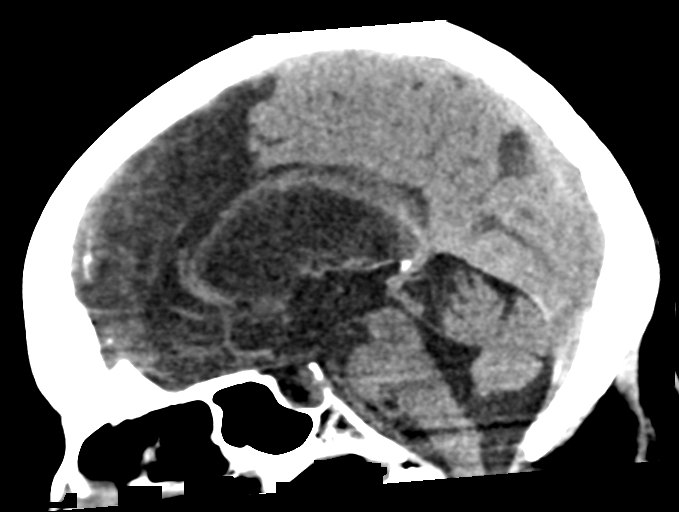
[im 36/54  brain]
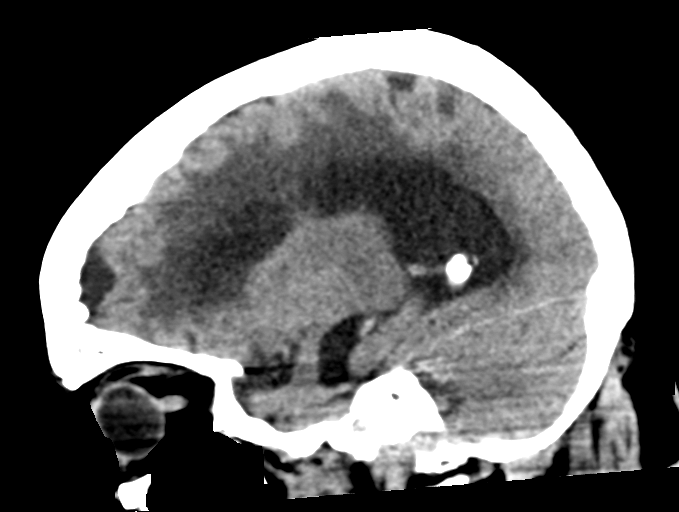

[15 of 44 positions shown; findings below may reference images not displayed]

FINDINGS: Brain: Severe cerebral and mild cerebellar atrophy. Ex vacuo
dilatation of the ventricular system. Patchy and confluent areas of
decreased attenuation are noted throughout the deep and
periventricular white matter of the cerebral hemispheres
bilaterally, compatible with chronic microvascular ischemic disease.
Physiologic calcifications of the basal ganglia bilaterally. No
definite evidence of acute infarction, hemorrhage, hydrocephalus,
extra-axial collection or mass lesion/mass effect.

Vascular: No hyperdense vessel or unexpected calcification.

Skull: Normal. Negative for fracture or focal lesion.

Sinuses/Orbits: Minimal mucosal thickening in the posterior aspect
of the right maxillary sinus. No acute finding.

Other: None.
IMPRESSION: 1. No acute intracranial abnormalities.
2. Severe cerebral and mild cerebellar atrophy with extensive
chronic microvascular ischemic changes throughout the cerebral white
matter, as above.

## 2017-04-11 IMAGING — DX DG HAND COMPLETE 3+V*R*
3 series · 3 of 3 positions shown · non-contrast
Comparison: None.

CLINICAL DATA: Pain with movement of thumb.  No known trauma.

EXAM:
RIGHT HAND - COMPLETE 3+ VIEW

[hand ap]
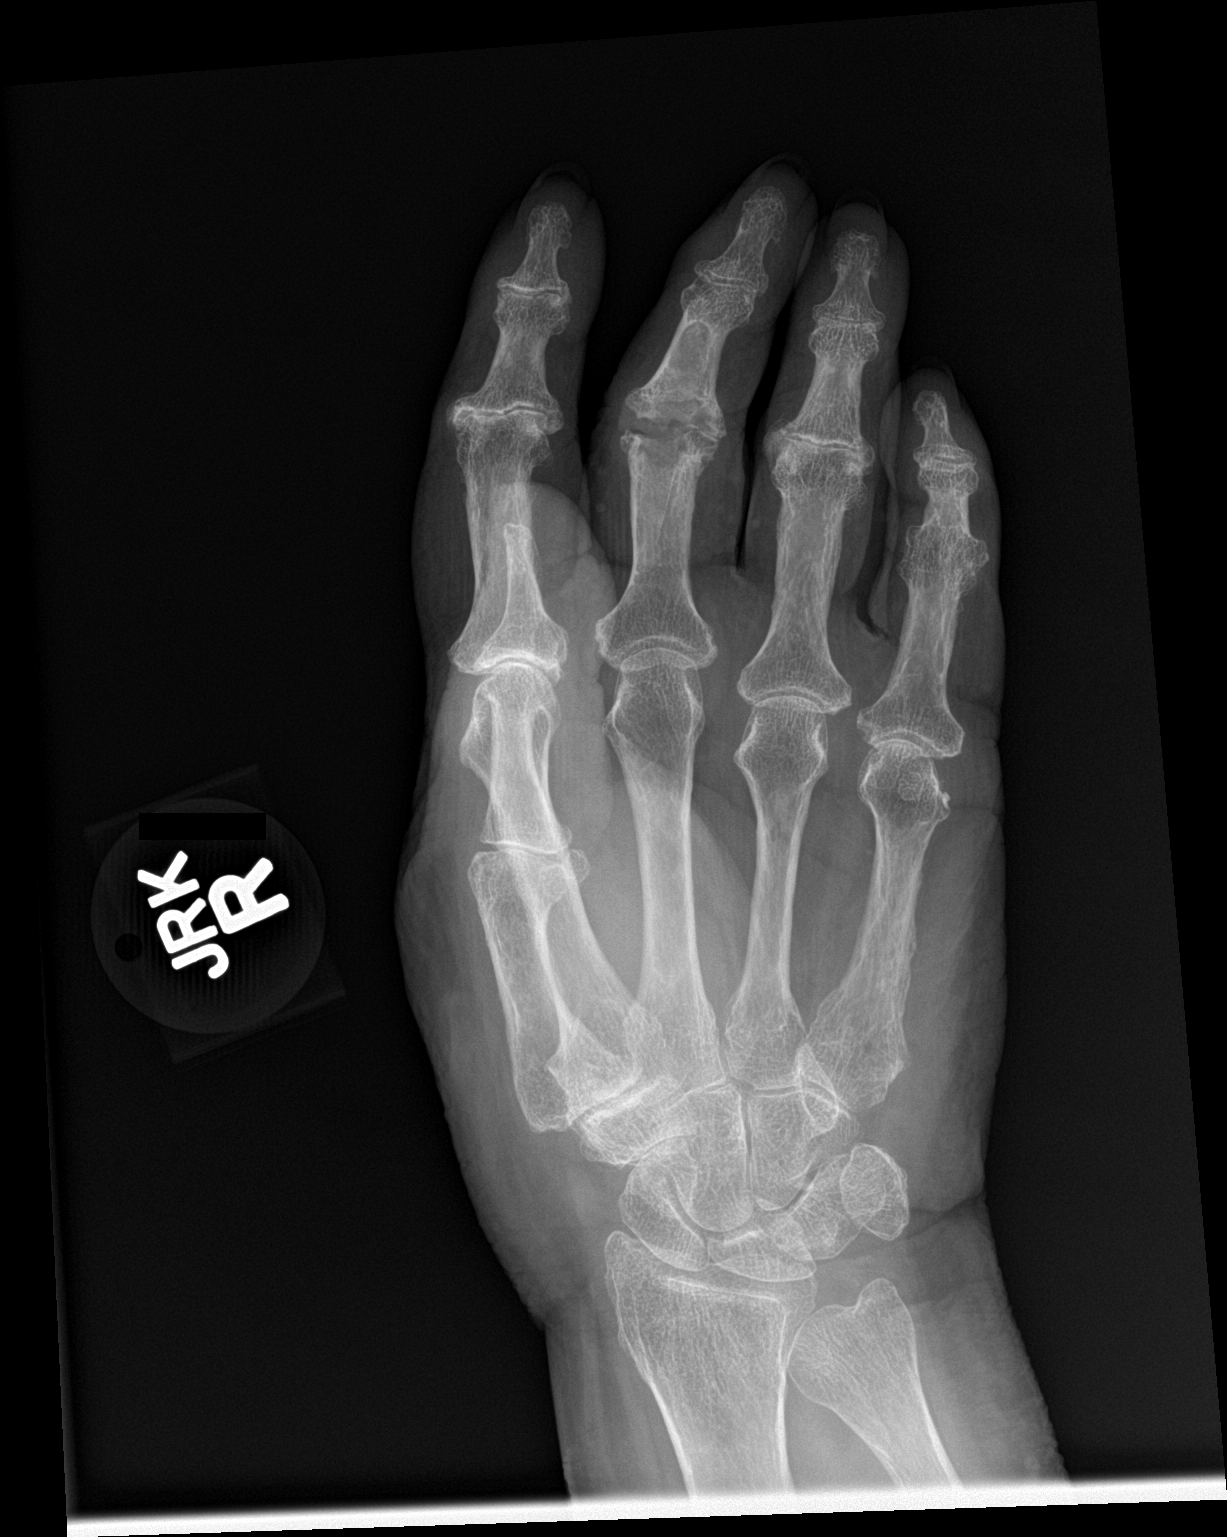

[hand obl]
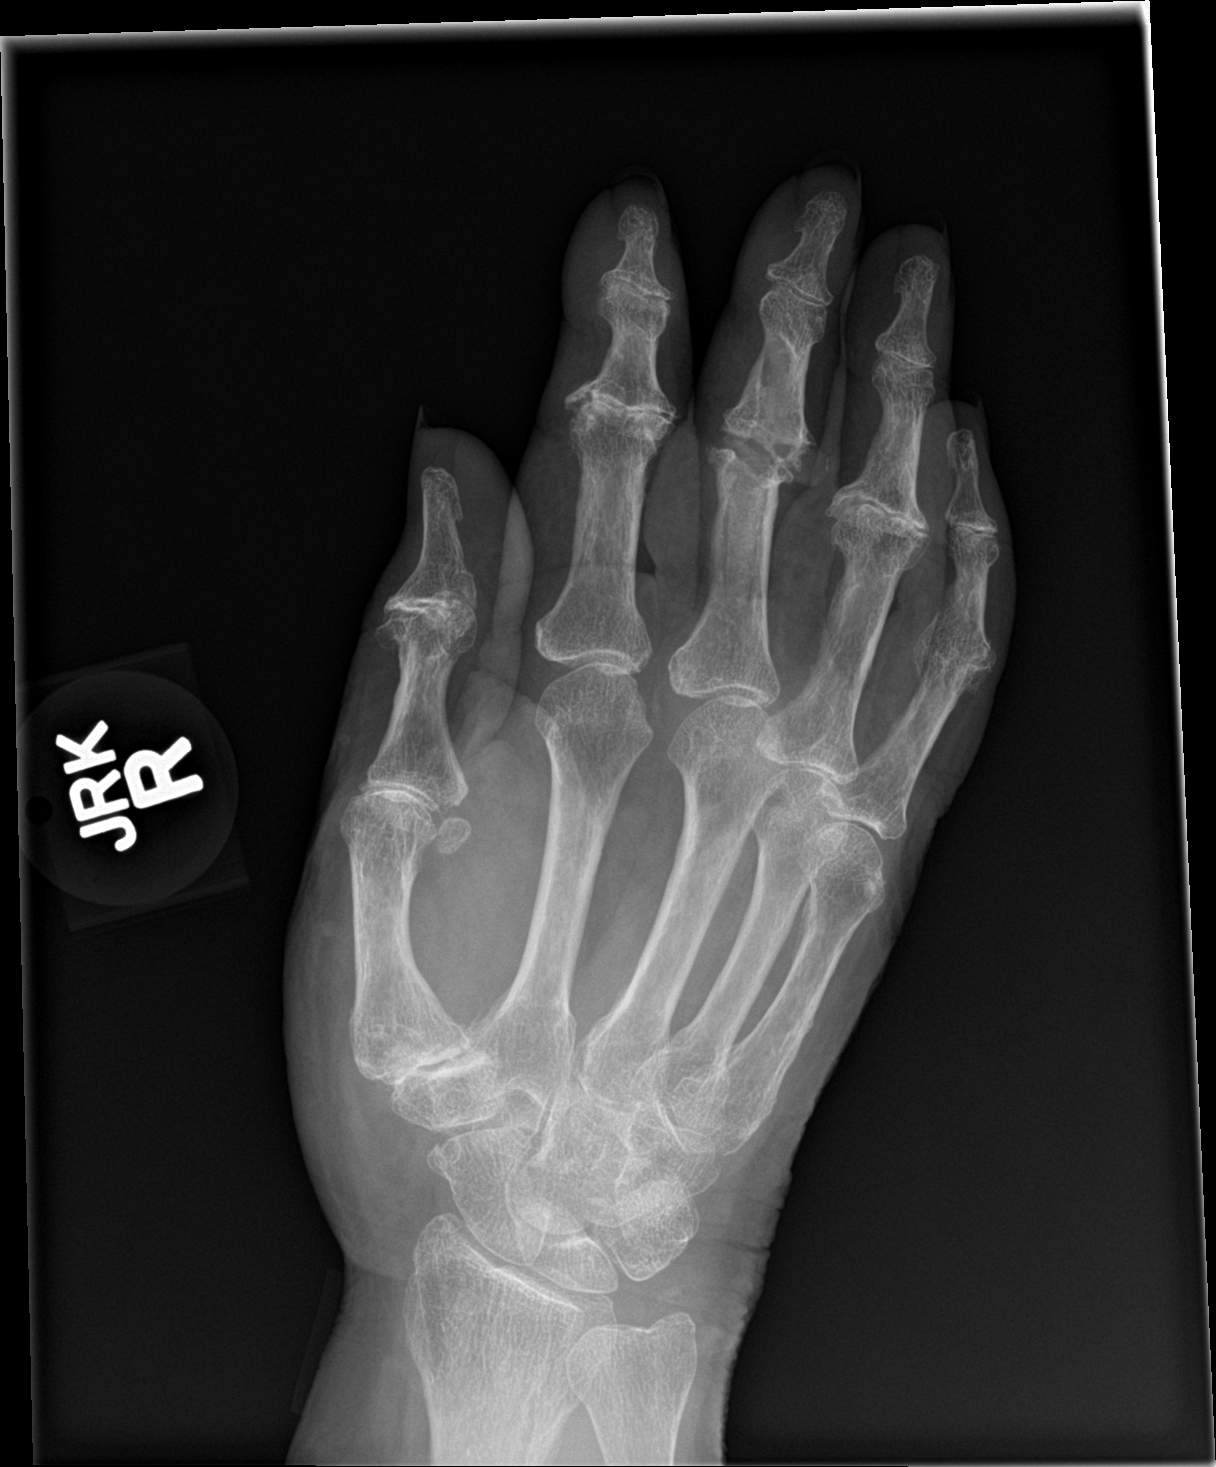

[hand lat]
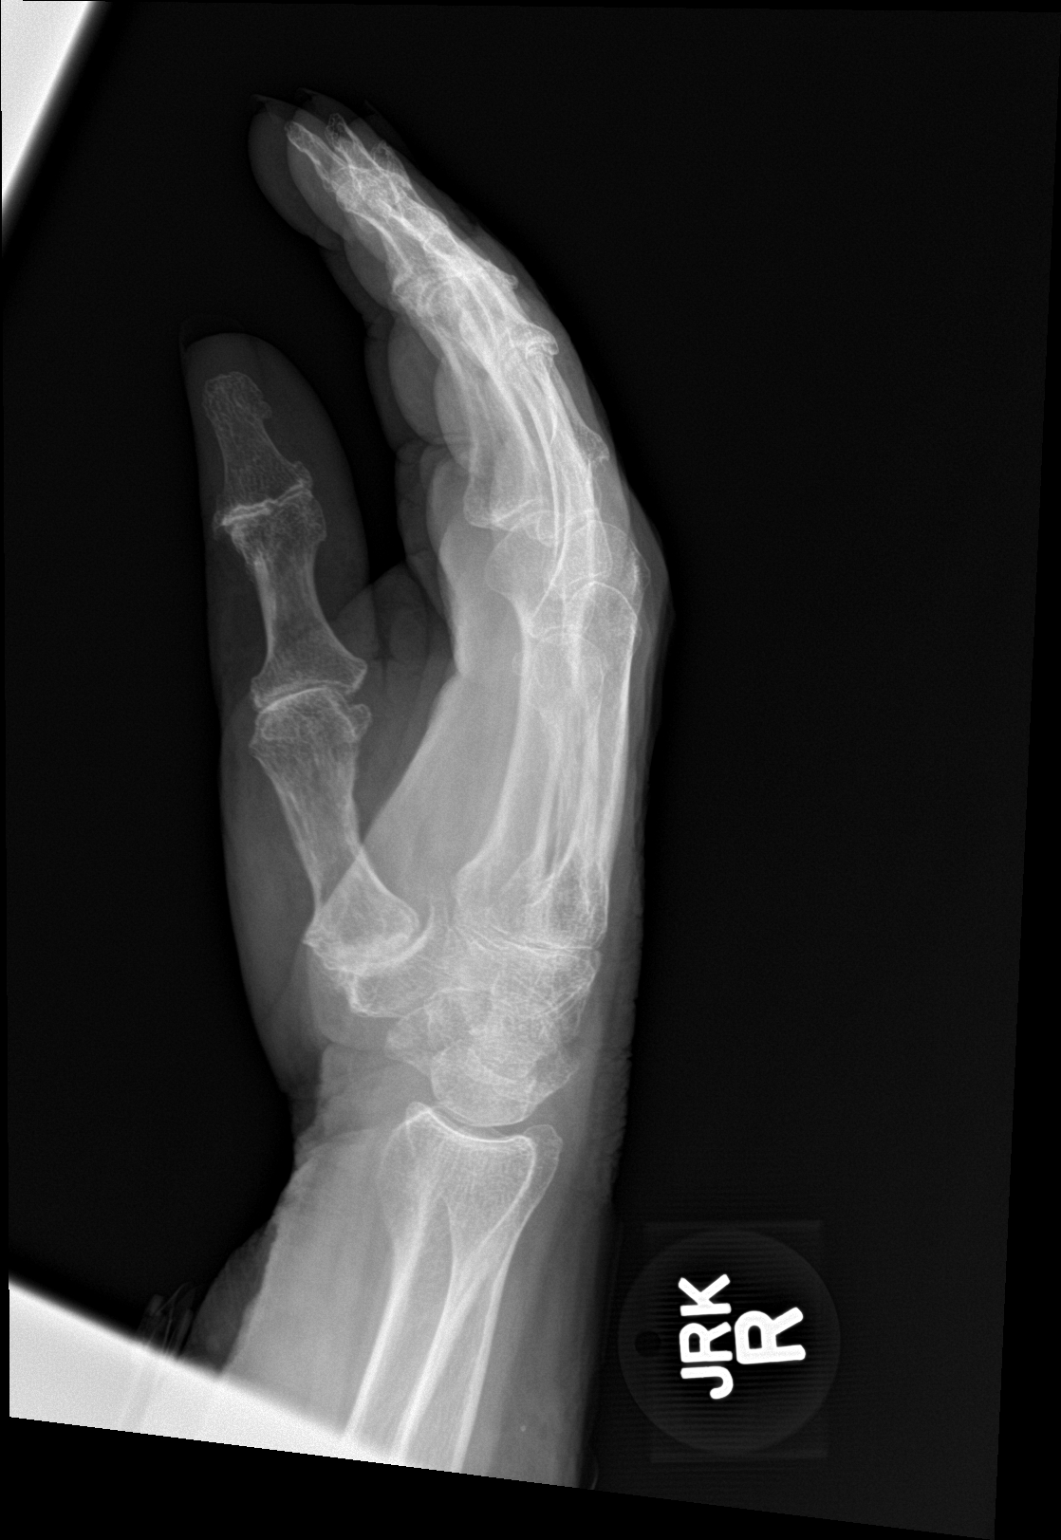

[3 of 3 positions shown; findings below may reference images not displayed]

FINDINGS: Negative for acute fracture, dislocation or radiopaque foreign body.
Moderately severe arthritic changes are present at the first
carpometacarpal joint. Moderate arthritic changes are present at the
first MCP joint and at the first interphalangeal joint.

There also are severe arthritic changes at the PIP joints. There is
prior surgical resection at the third PIP joint. There is ankylosis
at the fifth PIP joint.

No radiopaque foreign body.
IMPRESSION: Significant arthritic changes of the first digit and also of all PIP
joints. Negative for acute fracture, dislocation or radiopaque
foreign body.

## 2017-04-12 IMAGING — DX DG CHEST 1V PORT
1 series · 1 of 1 positions shown · non-contrast
Comparison: 01/10/2016

CLINICAL DATA: Dehydration.

EXAM:
PORTABLE CHEST 1 VIEW

[chest ap]
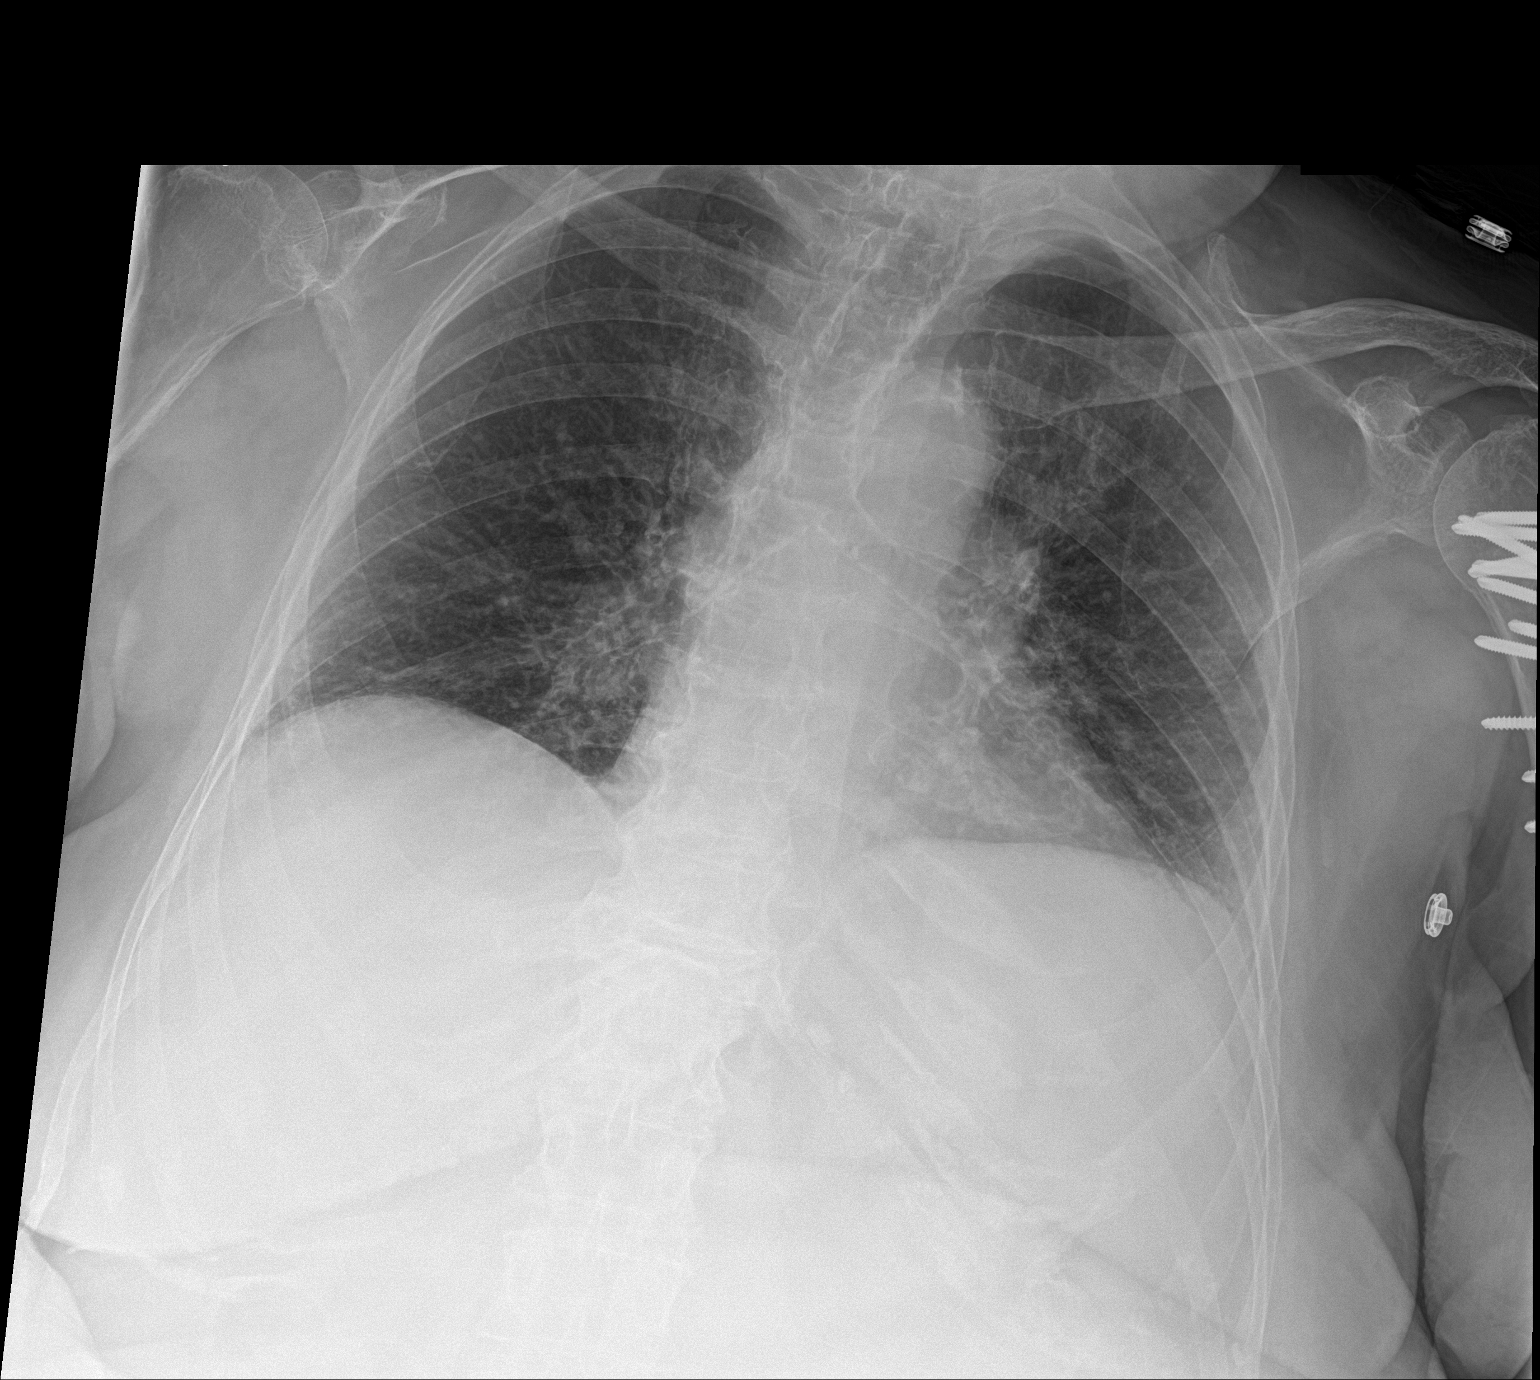

[1 of 1 positions shown; findings below may reference images not displayed]

FINDINGS: The lungs are clear except for mild chronic appearing interstitial
coarsening. No airspace consolidation. No effusions. Hilar and
mediastinal contours are unremarkable and unchanged.
IMPRESSION: No acute cardiopulmonary findings.
